# Patient Record
Sex: Female | Born: 1945 | Race: White | Hispanic: No | Marital: Married | Smoking: Former smoker
Health system: Southern US, Community
[De-identification: ages and names within clinical notes are randomized; demographics above are authoritative.]

## PROBLEM LIST (undated history)

## (undated) DIAGNOSIS — S72002A Fracture of unspecified part of neck of left femur, initial encounter for closed fracture: Secondary | ICD-10-CM

## (undated) DIAGNOSIS — N200 Calculus of kidney: Secondary | ICD-10-CM

## (undated) DIAGNOSIS — N390 Urinary tract infection, site not specified: Secondary | ICD-10-CM

## (undated) DIAGNOSIS — R739 Hyperglycemia, unspecified: Secondary | ICD-10-CM

## (undated) DIAGNOSIS — S52501A Unspecified fracture of the lower end of right radius, initial encounter for closed fracture: Secondary | ICD-10-CM

## (undated) HISTORY — PX: COLONOSCOPY: SHX174

## (undated) HISTORY — PX: TUBAL LIGATION: SHX77

## (undated) HISTORY — PX: HEMORRHOID SURGERY: SHX153

---

## 2003-01-10 HISTORY — PX: CARDIAC CATHETERIZATION: SHX172

## 2003-11-21 ENCOUNTER — Observation Stay (HOSPITAL_COMMUNITY): Admission: EM | Admit: 2003-11-21 | Discharge: 2003-11-21 | Payer: Self-pay | Admitting: Emergency Medicine

## 2003-11-30 ENCOUNTER — Inpatient Hospital Stay (HOSPITAL_COMMUNITY): Admission: AD | Admit: 2003-11-30 | Discharge: 2003-12-02 | Payer: Self-pay | Admitting: Cardiology

## 2003-11-30 ENCOUNTER — Ambulatory Visit: Payer: Self-pay | Admitting: Cardiology

## 2004-01-10 HISTORY — PX: CERVICAL FUSION: SHX112

## 2004-04-27 ENCOUNTER — Ambulatory Visit (HOSPITAL_COMMUNITY): Admission: RE | Admit: 2004-04-27 | Discharge: 2004-04-28 | Payer: Self-pay | Admitting: Neurosurgery

## 2009-04-20 ENCOUNTER — Ambulatory Visit: Payer: Self-pay | Admitting: Cardiology

## 2010-01-29 ENCOUNTER — Encounter: Payer: Self-pay | Admitting: *Deleted

## 2011-09-10 HISTORY — PX: FINGER ARTHROPLASTY: SHX5017

## 2011-10-03 ENCOUNTER — Encounter (HOSPITAL_BASED_OUTPATIENT_CLINIC_OR_DEPARTMENT_OTHER): Payer: Self-pay | Admitting: *Deleted

## 2011-10-03 ENCOUNTER — Other Ambulatory Visit: Payer: Self-pay | Admitting: Orthopedic Surgery

## 2011-10-03 NOTE — Progress Notes (Signed)
Will ck with anesthesia about the phentermine

## 2011-10-06 ENCOUNTER — Encounter (HOSPITAL_BASED_OUTPATIENT_CLINIC_OR_DEPARTMENT_OTHER): Payer: Self-pay | Admitting: Anesthesiology

## 2011-10-06 ENCOUNTER — Ambulatory Visit (HOSPITAL_BASED_OUTPATIENT_CLINIC_OR_DEPARTMENT_OTHER): Payer: Medicare Other | Admitting: Anesthesiology

## 2011-10-06 ENCOUNTER — Encounter (HOSPITAL_BASED_OUTPATIENT_CLINIC_OR_DEPARTMENT_OTHER): Payer: Self-pay | Admitting: *Deleted

## 2011-10-06 ENCOUNTER — Ambulatory Visit (HOSPITAL_BASED_OUTPATIENT_CLINIC_OR_DEPARTMENT_OTHER)
Admission: RE | Admit: 2011-10-06 | Discharge: 2011-10-06 | Disposition: A | Discharge status: Home / Self Care | Payer: Medicare Other | Source: Ambulatory Visit | Attending: Orthopedic Surgery | Admitting: Orthopedic Surgery

## 2011-10-06 ENCOUNTER — Encounter (HOSPITAL_BASED_OUTPATIENT_CLINIC_OR_DEPARTMENT_OTHER)
Admission: RE | Disposition: A | Discharge status: Home / Self Care | Payer: Self-pay | Source: Ambulatory Visit | Attending: Orthopedic Surgery

## 2011-10-06 DIAGNOSIS — W268XXA Contact with other sharp object(s), not elsewhere classified, initial encounter: Secondary | ICD-10-CM | POA: Insufficient documentation

## 2011-10-06 DIAGNOSIS — Y92009 Unspecified place in unspecified non-institutional (private) residence as the place of occurrence of the external cause: Secondary | ICD-10-CM | POA: Insufficient documentation

## 2011-10-06 DIAGNOSIS — IMO0002 Reserved for concepts with insufficient information to code with codable children: Secondary | ICD-10-CM | POA: Insufficient documentation

## 2011-10-06 DIAGNOSIS — S65509A Unspecified injury of blood vessel of unspecified finger, initial encounter: Secondary | ICD-10-CM | POA: Insufficient documentation

## 2011-10-06 DIAGNOSIS — Y93G1 Activity, food preparation and clean up: Secondary | ICD-10-CM | POA: Insufficient documentation

## 2011-10-06 DIAGNOSIS — Y998 Other external cause status: Secondary | ICD-10-CM | POA: Insufficient documentation

## 2011-10-06 DIAGNOSIS — S61209A Unspecified open wound of unspecified finger without damage to nail, initial encounter: Secondary | ICD-10-CM | POA: Insufficient documentation

## 2011-10-06 SURGERY — NERVE, TENDON AND ARTERY REPAIR
Anesthesia: General | Site: Finger | Laterality: Left | Wound class: Contaminated

## 2011-10-06 MED ORDER — HEPARIN SODIUM (PORCINE) 1000 UNIT/ML IJ SOLN
INTRAMUSCULAR | Status: DC | PRN
  Administered 2011-10-06: 1000 [IU]

## 2011-10-06 MED ORDER — LIDOCAINE HCL 2 % IJ SOLN
INTRAMUSCULAR | Status: DC | PRN
  Administered 2011-10-06: 7 mL

## 2011-10-06 MED ORDER — HYDROMORPHONE HCL PF 1 MG/ML IJ SOLN: 0.2500 mg | INTRAMUSCULAR | Status: DC | PRN

## 2011-10-06 MED ORDER — OXYCODONE HCL 5 MG/5ML PO SOLN: 5.0000 mg | Freq: Once | ORAL | Status: DC | PRN

## 2011-10-06 MED ORDER — LACTATED RINGERS IR SOLN
Status: DC | PRN
  Administered 2011-10-06: 1

## 2011-10-06 MED ORDER — CHLORHEXIDINE GLUCONATE 4 % EX LIQD: 60.0000 mL | Freq: Once | CUTANEOUS | Status: DC

## 2011-10-06 MED ORDER — DEXAMETHASONE SODIUM PHOSPHATE 10 MG/ML IJ SOLN
INTRAMUSCULAR | Status: DC | PRN
  Administered 2011-10-06: 10 mg via INTRAVENOUS

## 2011-10-06 MED ORDER — HYDROMORPHONE HCL 2 MG PO TABS: 2.0000 mg | ORAL_TABLET | ORAL | Status: DC | PRN

## 2011-10-06 MED ORDER — LIDOCAINE HCL (CARDIAC) 20 MG/ML IV SOLN
INTRAVENOUS | Status: DC | PRN
  Administered 2011-10-06: 100 mg via INTRAVENOUS

## 2011-10-06 MED ORDER — EPHEDRINE SULFATE 50 MG/ML IJ SOLN
INTRAMUSCULAR | Status: DC | PRN
  Administered 2011-10-06: 10 mg via INTRAVENOUS

## 2011-10-06 MED ORDER — PROPOFOL 10 MG/ML IV BOLUS
INTRAVENOUS | Status: DC | PRN
  Administered 2011-10-06: 160 mg via INTRAVENOUS

## 2011-10-06 MED ORDER — DOXYCYCLINE HYCLATE 100 MG PO TABS: 100.0000 mg | ORAL_TABLET | Freq: Two times a day (BID) | ORAL | Status: DC

## 2011-10-06 MED ORDER — OXYCODONE HCL 5 MG PO TABS: 5.0000 mg | ORAL_TABLET | Freq: Once | ORAL | Status: DC | PRN

## 2011-10-06 MED ORDER — VANCOMYCIN HCL IN DEXTROSE 1-5 GM/200ML-% IV SOLN
1000.0000 mg | INTRAVENOUS | Status: AC
  Administered 2011-10-06: 1000 mg via INTRAVENOUS

## 2011-10-06 MED ORDER — ONDANSETRON HCL 4 MG/2ML IJ SOLN
INTRAMUSCULAR | Status: DC | PRN
  Administered 2011-10-06: 4 mg via INTRAVENOUS

## 2011-10-06 MED ORDER — FENTANYL CITRATE 0.05 MG/ML IJ SOLN
INTRAMUSCULAR | Status: DC | PRN
  Administered 2011-10-06: 50 ug via INTRAVENOUS
  Administered 2011-10-06: 100 ug via INTRAVENOUS
  Administered 2011-10-06 (×2): 25 ug via INTRAVENOUS

## 2011-10-06 MED ORDER — LACTATED RINGERS IV SOLN
INTRAVENOUS | Status: DC
  Administered 2011-10-06 (×2): via INTRAVENOUS

## 2011-10-06 MED ORDER — LIDOCAINE HCL (PF) 1 % IJ SOLN
INTRAMUSCULAR | Status: DC | PRN
  Administered 2011-10-06: 10 mL

## 2011-10-06 MED ORDER — DROPERIDOL 2.5 MG/ML IJ SOLN: 0.6250 mg | INTRAMUSCULAR | Status: DC | PRN

## 2011-10-06 SURGICAL SUPPLY — 66 items
BANDAGE ADHESIVE 1X3 (GAUZE/BANDAGES/DRESSINGS) IMPLANT
BANDAGE CONFORM 3  STR LF (GAUZE/BANDAGES/DRESSINGS) IMPLANT
BANDAGE ELASTIC 3 VELCRO ST LF (GAUZE/BANDAGES/DRESSINGS) ×2 IMPLANT
BANDAGE GAUZE ELAST BULKY 4 IN (GAUZE/BANDAGES/DRESSINGS) IMPLANT
BLADE MINI RND TIP GREEN BEAV (BLADE) IMPLANT
BLADE SURG 15 STRL LF DISP TIS (BLADE) ×1 IMPLANT
BLADE SURG 15 STRL SS (BLADE) ×2
BNDG CMPR 9X4 STRL LF SNTH (GAUZE/BANDAGES/DRESSINGS) ×1
BNDG ESMARK 4X9 LF (GAUZE/BANDAGES/DRESSINGS) ×2 IMPLANT
BRUSH SCRUB EZ PLAIN DRY (MISCELLANEOUS) ×2 IMPLANT
CLOTH BEACON ORANGE TIMEOUT ST (SAFETY) ×2 IMPLANT
CORDS BIPOLAR (ELECTRODE) ×2 IMPLANT
COVER MAYO STAND STRL (DRAPES) ×2 IMPLANT
COVER TABLE BACK 60X90 (DRAPES) ×2 IMPLANT
CUFF TOURNIQUET SINGLE 18IN (TOURNIQUET CUFF) ×2 IMPLANT
DECANTER SPIKE VIAL GLASS SM (MISCELLANEOUS) ×2 IMPLANT
DRAPE EXTREMITY T 121X128X90 (DRAPE) ×2 IMPLANT
DRAPE SURG 17X23 STRL (DRAPES) ×2 IMPLANT
GAUZE XEROFORM 1X8 LF (GAUZE/BANDAGES/DRESSINGS) IMPLANT
GLOVE BIO SURGEON STRL SZ 6.5 (GLOVE) ×4 IMPLANT
GLOVE BIOGEL M STRL SZ7.5 (GLOVE) ×2 IMPLANT
GLOVE BIOGEL PI IND STRL 7.0 (GLOVE) ×1 IMPLANT
GLOVE BIOGEL PI IND STRL 8 (GLOVE) ×2 IMPLANT
GLOVE BIOGEL PI INDICATOR 7.0 (GLOVE) ×1
GLOVE BIOGEL PI INDICATOR 8 (GLOVE) ×2
GLOVE ORTHO TXT STRL SZ7.5 (GLOVE) ×2 IMPLANT
GOWN PREVENTION PLUS XLARGE (GOWN DISPOSABLE) ×2 IMPLANT
GOWN PREVENTION PLUS XXLARGE (GOWN DISPOSABLE) ×4 IMPLANT
LOOP VESSEL MAXI BLUE (MISCELLANEOUS) ×2 IMPLANT
NDL SAFETY ECLIPSE 18X1.5 (NEEDLE) ×1 IMPLANT
NEEDLE 27GAX1X1/2 (NEEDLE) ×2 IMPLANT
NEEDLE HYPO 18GX1.5 SHARP (NEEDLE) ×2
NEEDLE HYPO 25X1 1.5 SAFETY (NEEDLE) IMPLANT
NS IRRIG 1000ML POUR BTL (IV SOLUTION) ×2 IMPLANT
PACK BASIN DAY SURGERY FS (CUSTOM PROCEDURE TRAY) ×2 IMPLANT
PAD CAST 3X4 CTTN HI CHSV (CAST SUPPLIES) ×1 IMPLANT
PADDING CAST ABS 4INX4YD NS (CAST SUPPLIES) ×1
PADDING CAST ABS COTTON 4X4 ST (CAST SUPPLIES) ×1 IMPLANT
PADDING CAST COTTON 3X4 STRL (CAST SUPPLIES) ×2
SLEEVE SCD COMPRESS KNEE MED (MISCELLANEOUS) ×2 IMPLANT
SPEAR EYE SURG WECK-CEL (MISCELLANEOUS) ×2 IMPLANT
SPLINT PLASTER CAST XFAST 3X15 (CAST SUPPLIES) ×4 IMPLANT
SPLINT PLASTER XTRA FASTSET 3X (CAST SUPPLIES) ×4
SPONGE GAUZE 4X4 12PLY (GAUZE/BANDAGES/DRESSINGS) ×2 IMPLANT
STOCKINETTE 4X48 STRL (DRAPES) ×2 IMPLANT
STRIP CLOSURE SKIN 1/2X4 (GAUZE/BANDAGES/DRESSINGS) ×2 IMPLANT
SUT ETHILON 10 0 V75 3 (SUTURE) ×2 IMPLANT
SUT ETHILON 5 0 P 3 18 (SUTURE) ×1
SUT ETHILON 9 0 V 100.4 (SUTURE) IMPLANT
SUT FIBERWIRE 3-0 18 TAPR NDL (SUTURE) ×2
SUT MERSILENE 4 0 P 3 (SUTURE) ×2 IMPLANT
SUT MERSILENE 6 0 P 1 (SUTURE) ×2 IMPLANT
SUT NYLON ETHILON 5-0 P-3 1X18 (SUTURE) ×1 IMPLANT
SUT PROLENE 3 0 PS 2 (SUTURE) IMPLANT
SUT SILK 4 0 PS 2 (SUTURE) ×2 IMPLANT
SUT VIC AB 4-0 P-3 18XBRD (SUTURE) IMPLANT
SUT VIC AB 4-0 P2 18 (SUTURE) IMPLANT
SUT VIC AB 4-0 P3 18 (SUTURE)
SUTURE FIBERWR 3-0 18 TAPR NDL (SUTURE) ×1 IMPLANT
SYR 3ML 23GX1 SAFETY (SYRINGE) IMPLANT
SYR BULB 3OZ (MISCELLANEOUS) IMPLANT
SYR CONTROL 10ML LL (SYRINGE) ×4 IMPLANT
TOWEL OR 17X24 6PK STRL BLUE (TOWEL DISPOSABLE) ×2 IMPLANT
TRAY DSU PREP LF (CUSTOM PROCEDURE TRAY) ×2 IMPLANT
UNDERPAD 30X30 INCONTINENT (UNDERPADS AND DIAPERS) ×2 IMPLANT
WATER STERILE IRR 1000ML POUR (IV SOLUTION) IMPLANT

## 2011-10-06 NOTE — Anesthesia Postprocedure Evaluation (Signed)
Anesthesia Post Note  Patient: [name]  Procedure(s) Performed: Procedure(s) (LRB): NERVE, TENDON AND ARTERY REPAIR (Left)  Anesthesia type: general  Patient location: PACU  Post pain: Pain level controlled  Post assessment: Patient's Cardiovascular Status Stable  Last Vitals:  Filed Vitals:   10/06/11 1538  BP:   Pulse: 90  Temp: 36.5 C  Resp: 20    Post vital signs: Reviewed and stable  Level of consciousness: sedated  Complications: No apparent anesthesia complications

## 2011-10-06 NOTE — Transfer of Care (Signed)
Immediate Anesthesia Transfer of Care Note  Patient: [name]  Procedure(s) Performed: Procedure(s) (LRB) with comments: NERVE, TENDON AND ARTERY REPAIR (Left) - Repair tendon artery nerve left long and explore left index   Patient Location: PACU  Anesthesia Type: General  Level of Consciousness: sedated  Airway & Oxygen Therapy: Patient Spontanous Breathing  Post-op Assessment: Report given to PACU RN  Post vital signs: stable  Complications: No apparent anesthesia complications

## 2011-10-06 NOTE — Anesthesia Procedure Notes (Signed)
Procedure Name: LMA Insertion Date/Time: 10/06/2011 11:28 AM Performed by: Burna Cash Pre-anesthesia Checklist: Patient identified, Emergency Drugs available, Suction available and Patient being monitored Patient Re-evaluated:Patient Re-evaluated prior to inductionOxygen Delivery Method: Circle System Utilized Preoxygenation: Pre-oxygenation with 100% oxygen Intubation Type: IV induction Ventilation: Mask ventilation without difficulty LMA: LMA inserted LMA Size: 4.0 Number of attempts: 1 Airway Equipment and Method: bite block Placement Confirmation: positive ETCO2 Tube secured with: Tape Dental Injury: Teeth and Oropharynx as per pre-operative assessment

## 2011-10-06 NOTE — Op Note (Signed)
[id number] 

## 2011-10-06 NOTE — Anesthesia Preprocedure Evaluation (Addendum)
Anesthesia Evaluation  Patient identified by MRN, date of birth, ID band Patient awake    Reviewed: Allergy & Precautions, H&P , NPO status , Patient's Chart, lab work & pertinent test results  History of Anesthesia Complications Negative for: history of anesthetic complications  Airway Mallampati: I TM Distance: >3 FB Neck ROM: Full    Dental  (+) Teeth Intact, Caps and Dental Advisory Given   Pulmonary neg pulmonary ROS,  breath sounds clear to auscultation  Pulmonary exam normal       Cardiovascular negative cardio ROS      Neuro/Psych negative neurological ROS     GI/Hepatic negative GI ROS, Neg liver ROS,   Endo/Other  negative endocrine ROS  Renal/GU negative Renal ROS     Musculoskeletal   Abdominal   Peds  Hematology negative hematology ROS (+)   Anesthesia Other Findings   Reproductive/Obstetrics                           Anesthesia Physical Anesthesia Plan  ASA: I  Anesthesia Plan: General   Post-op Pain Management:    Induction: Intravenous  Airway Management Planned: LMA  Additional Equipment:   Intra-op Plan:   Post-operative Plan: Extubation in OR  Informed Consent: I have reviewed the patients History and Physical, chart, labs and discussed the procedure including the risks, benefits and alternatives for the proposed anesthesia with the patient or authorized representative who has indicated his/her understanding and acceptance.   Dental advisory given  Plan Discussed with: CRNA, Anesthesiologist and Surgeon  Anesthesia Plan Comments:         Anesthesia Quick Evaluation

## 2011-10-06 NOTE — Brief Op Note (Signed)
10/06/2011  1:30 PM  PATIENT:  Drue Novel  66 y.o. female  PRE-OPERATIVE DIAGNOSIS:  Left long laceration tendon arteries and nerves   POST-OPERATIVE DIAGNOSIS:  Left long laceration tendon arteries and nerves   PROCEDURE:  Procedure(s) (LRB) with comments: NERVE, TENDON AND ARTERY REPAIR (Left) - Repair tendon artery nerve left long and explore left index   SURGEON:  Surgeon(s) and Role:    * Wyn Forster., MD - Primary  PHYSICIAN ASSISTANT:   ASSISTANTS: Mallory Shirk.A-C   ANESTHESIA:   general  EBL:  Total I/O In: 1000 [I.V.:1000] Out: -   BLOOD ADMINISTERED:none  DRAINS: none   LOCAL MEDICATIONS USED:  XYLOCAINE   SPECIMEN:  No Specimen  DISPOSITION OF SPECIMEN:  N/A  COUNTS:  YES  TOURNIQUET:   Total Tourniquet Time Documented: Upper Arm (Left) - 100 minutes  DICTATION: .Other Dictation: Dictation Number [id number]  PLAN OF CARE: Discharge to home after PACU  PATIENT DISPOSITION:  PACU - hemodynamically stable.

## 2011-10-06 NOTE — H&P (Signed)
  [name] is an 66 y.o. female.   Chief Complaint: c/o lacerations to letf index and left long fingers HPI: Pt is a 66 y/o female who sustained lacerations to the left index and long fingers on Sunday 10/01/11 after attempting to catch a glass jar that was falling out of her refrigerator. She noted lacerations to the volar aspect of the left index and long fingers. She went to [facility] were the wounds were cleaned and sutured and the patient was referred for evaluation and treatment. Her tetanus is up to date.She was seen in our office on Monday 10/02/11 and scheduled for exploration and repair tendons, arteries and nerves as needed of the left index and long fingers.  Past Medical History  Diagnosis Date  . No pertinent past medical history     Past Surgical History  Procedure Date  . Cardiac catheterization 2005    cone-report in echart-not in epic  . Hemorrhoid surgery   . Cervical fusion 2006  . Tubal ligation   . Colonoscopy     No family history on file. Social History:  reports that she quit smoking about 25 years ago. She does not have any smokeless tobacco history on file. She reports that she does not drink alcohol or use illicit drugs.  Allergies:  Allergies  Allergen Reactions  . Penicillins Hives    No prescriptions prior to admission    No results found for this or any previous visit (from the past 48 hour(s)).  No results found.   Pertinent items are noted in HPI.  Height 5\' 5"  (1.651 m), weight 72.576 kg (160 lb).  General appearance: alert Head: Normocephalic, without obvious abnormality Neck: supple, symmetrical, trachea midline Resp: clear to auscultation bilaterally Cardio: regular rate and rhythm GI: normal findings: bowel sounds normal Extremities: Exam of the left hand reveals laceration at the MP flexion crease of the index finger. She has intact FDP/FDS function in addition to intact sensibility on both the radial and ulnar aspect  of the digit. Exam of the left long reveals an oblique laceration across the P-1 segment. There is absent flexor tendon function and decreased sensibility distal to the laceration. There is good capillary refill and turgor in both the index and long fingers. Pulses: 2+ and symmetric Skin: normal Neurologic: Grossly normal    Assessment/Plan Impression:Probable tendon, artery and nerve lacerations to the right long finger and possibly the right index finger  Plan:To the OR for exploration/repair tendons,arteries and nerves right index and right long fingers.The procedure, risks,benefits and post-op course were discussed with the patient at length and they were in agreement with the plan.   DASNOIT,Iyari Hagner J 10/06/2011, 7:26 AM    H&P documentation: 10/06/2011  -History and Physical Reviewed  -Patient has been re-examined  -No change in the plan of care  Wyn Forster, MD

## 2011-10-09 NOTE — Op Note (Signed)
NAMEPENI, RUPARD                ACCOUNT NO.:  [id number]  MEDICAL RECORD NO.:  [id number]  LOCATION:                                 FACILITY:  PHYSICIAN:  Katy Fitch. Harlee Pursifull, M.D. DATE OF BIRTH:  [birth year]  DATE OF PROCEDURE:  10/06/2011 DATE OF DISCHARGE:                              OPERATIVE REPORT   PREOPERATIVE DIAGNOSIS:  Status post glass laceration to palmar surface of left long and index fingers with loss of sensibility in the long finger and impaired flexion of the index and long finger.  The long finger was lying in full extension evidencing laceration of the flexor digitorum profundus and superficialis tendons.  POSTOPERATIVE DIAGNOSIS:  Laceration of radial proper digital artery, left long finger; laceration of ulnar proper digital artery, left long finger; laceration of flexor digitorum superficialis tendon zone II at A2 pulley, left long finger; laceration of flexor digitorum profundus tendon, zone II, left long finger; laceration of A2 pulley and laceration of A2 pulley and partial laceration of flexor digitorum superficialis tendon of left index finger; also laceration of radial proper digital nerve, left long finger; and laceration of ulnar proper digital nerve, left long finger.  OPERATIONS: 1. Microsurgical repair of left long finger, ulnar proper digital     artery with 10-0 nylon. 2. Microsurgical repair of left long finger, radial proper digital     artery with 10-0 nylon. 3. Microsurgical repair of left long finger, ulnar proper digital     nerve. 4. Microsurgical repair of left long finger, radial proper digital     nerve. 5. Repair of flexor digitorum superficialis tendon slips at     decussation. 6. Repair of flexor digitorum profundus, zone II, A2 pulley, left long     finger. 7. Debridement of partial flap laceration of left index finger, flexor     digitorum superficialis tendon with decision to perform tendon     debridement and repair of  A2 pulley.  OPERATING SURGEON:  Katy Fitch. Kasee Hantz, MD  ASSISTANT:  Marveen Reeks Dasnoit, PA-C  ANESTHESIA:  General by LMA.  SUPERVISED ANESTHESIOLOGIST:  Quita Skye. Krista Blue, MD  INDICATIONS:  [name] is a 66 year old, right-hand-dominant, well- known patient of our practice, who on October 01, 2011, accidentally lost control of a pickle jar which broke in her hand sustaining deep lacerations to the palmar surface of her left index and long fingers, in the index finger directly at the proximal finger flexion crease and in the long finger extending from the proximal finger flexion crease and radial web with the index finger across to the PIP flexion crease.  She had immediate extension posture of her long finger and inability to flex her index finger without pain.  She had complete loss of sensibility in the long finger Evidencing laceration of the radial and ulnar proper digital nerves.  She was seen at the [facility] Emergency Room, where wounds were cleaned and sutured.  She was advised to follow up with a hand surgery consult.  Clinical examination in our office revealed that she had intact capillary refill to her index and long fingers but did have diminished turgor in the long finger.  We  recommended exploration and repair of her neurovascular structures as well as the lacerated flexor tendons.  After informed consent, she is brought to the operating room at this time.  Preoperatively, she was interviewed by Dr. Krista Blue, who recommended general anesthesia by LMA technique.  Our plan was to provide perioperative prophylactic antibiotics in the form of Ancef.  PROCEDURE:  [name] was brought to room #1 of the [facility] and placed in supine position on the operating table.  Following induction of general anesthesia by LMA technique, the left hand and arm were prepped with Betadine soap and solution, sterilely draped.  2 g of Ancef were administered  as an IV prophylactic antibiotic.  The sutures from the [facility] Emergency Room were removed.  The wounds were then cleaned thoroughly with sterile saline.  We performed exsanguination of the left arm and hand with an Esmarch bandage and inflation of arterial tourniquet on the proximal brachium to 220 mmHg.  Following routine surgical time-out, the procedure commenced with opening of the index wound and exploring the flexor sheath.  The radial and ulnar neurovascular bundles were noted to be intact.  There was a flap laceration of the A2 pulley and a long oblique laceration of superficialis tendon that was less than 30% of the diameter tendon; therefore, this was trimmed and the flexor retinaculum closed.  We utilized 6-0 Mersilene suture to anatomic repair of the A2 pulley.  Attention was then turned to the long finger.  With the aid of an operating microscope, we examined the radial and ulnar proper digital nerves and arteries and identified laceration of both proper digital nerves and both proper digital arteries. Apparently, Ms. Shall has adequate dorsal circulation to maintain a viable finger.  The radial and ulnar proper digital arteries were prepared in standard manner by trimming of clot, dilation with a micro vessel dilator followed by use of Tsai solution to obtain anticoagulation and relaxation of the vessels.  The proper digital nerves were likewise identified and prepared in standard manner for microsurgical repair.  With the aid of the operating microscope with various powers, we repaired the radial and ulnar proper digital arteries with 10-0 nylon suture utilizing back wall-first technique.  The proper digital nerves were then repaired with 9-0 nylon utilizing epineural technique.  We then flexed the finger slightly and began our plans for repair of the flexor tendons.  The profundus superficialis tendon slips were easily identified beneath the A3 pulley  distally.  We had to perform a second incision in the palm proximal to the A1 pulley to recover the flexor tendons and pass them distally.  The superficialis tendon was repaired with core suture of 4-0 Mersilene and finishing suture of 6-0 Mersilene.  The profundus tendon was repaired with core suture of 3-0 FiberWire with grasping technique followed by circumferential 6-0 Mersilene finishing suture.  We anatomically repaired the A2 pulley and retinacular sheath and noted full passive motion of the finger.  We had excellent glide of both repairs.  The wounds were then examined after release of the tourniquet.  The long finger developed immediate capillary refill of less than 2 seconds with normal turgor.  The index finger had normal capillary refill and turgor. The wounds were then closed with interrupted sutures of 5-0 nylon.  There were no apparent complications.  Ms. Bieker tolerated the surgery and anesthesia well.  She was placed in compressive dressing with the wrist flexed 45 degrees.  Her MP joint of the index and long  finger flexed at least 60 degrees.  She will be advised to elevate her hand above her heart for the next 48 hours.  We will see her back for followup in the office in 4-5 days.  At that time, we will advance to a passive and place and hold exercise program under the supervision of our hand therapist.  For aftercare, she is provided prescriptions for Dilaudid 2 mg 1 or 2 tablets p.o. q.4-6 h. p.r.n. pain, #30 tablets without refill; also doxycycline 100 mg p.o. q.12 h. x4 days as prophylactic antibiotic.     Katy Fitch Graesyn Schreifels, M.D.     RVS/MEDQ  D:  10/06/2011  T:  10/07/2011  Job:  [id number]  cc:   Doreen Beam, MD

## 2012-04-19 ENCOUNTER — Other Ambulatory Visit: Payer: Self-pay | Admitting: Orthopedic Surgery

## 2012-04-25 ENCOUNTER — Encounter (HOSPITAL_BASED_OUTPATIENT_CLINIC_OR_DEPARTMENT_OTHER): Payer: Self-pay | Admitting: *Deleted

## 2012-04-25 NOTE — Progress Notes (Signed)
Pt was here 9/13 for repair finger from laceration-still cannot move lt long well-

## 2012-05-01 NOTE — H&P (Addendum)
[name] is an 67 y.o. female.   Chief Complaint:c/o chronic stiffness of digits of the left hand HPI: [name] is now 6  months status post left long finger tendon/artery/nerve reconstruction.  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  She has profound tenodesis of her flexors and extensors.     Past Medical History  Diagnosis Date  . No pertinent past medical history   . Medical history non-contributory     Past Surgical History  Procedure Laterality Date  . Cardiac catheterization  2005    cone-report in echart-not in epic  . Hemorrhoid surgery    . Cervical fusion  2006  . Tubal ligation    . Colonoscopy    . Finger arthroplasty  9/13    lt long and index    History reviewed. No pertinent family history. Social History:  reports that she quit smoking about 25 years ago. She does not have any smokeless tobacco history on file. She reports that she does not drink alcohol or use illicit drugs.  Allergies:  Allergies  Allergen Reactions  . Sulfa Antibiotics Rash  . Penicillins Hives    No prescriptions prior to admission    No results found for this or any previous visit (from the past 48 hour(s)).  No results found.   Pertinent items are noted in HPI.  There were no vitals taken for this visit.  General appearance: alert Head: Normocephalic, without obvious abnormality Neck: supple, symmetrical, trachea midline Resp: clear to auscultation bilaterally Cardio: regular rate and rhythm GI: normal findings: bowel sounds normal Extremities:Her finger remains quite stiff despite both the patient and our therapists' efforts.  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  She has no flexion of the long finger and less than 50% flexionn of the ring and small due to her CRPS 2 response despite maximum  therapy during the past six months.  This is due to quadriga and capsular fibrosis. 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  She has general stiffness of the hand due to a CRPS type 2 response following her injuries.  She has had maximum therapy efforts that were limited by the need to protect her flexor repairs.  Plan:To the OR for tenolysis flexor/extensor tendons with capsular releases left long finger and manipulation of IP/MP joints left hand.The procedure, risks,benefits and post-op course were discussed with the patient at length and they were in agreement with the plan. [name] understands that she will have to participate vigorously in therapy and in a home based exercise program to improve the stiffness of her hand.  There is the risk that she will not gain or maintain motion that we achieve in the operating room by releasing scar and joint capsules.  She understands that this is a "best effort" by her surgeon and therapy team and that her participation will ultimately be the determinant of success or not in this endeavor.  DASNOIT,[name] 05/01/2012, 4:33 PM  H&P documentation: 05/02/2012  -History and Physical Reviewed  -Patient has been re-examined  -No change in the plan  of care  Wyn Forster, MD

## 2012-05-02 ENCOUNTER — Ambulatory Visit (HOSPITAL_BASED_OUTPATIENT_CLINIC_OR_DEPARTMENT_OTHER)
Admission: RE | Admit: 2012-05-02 | Discharge: 2012-05-02 | Disposition: A | Discharge status: Home / Self Care | Payer: Medicare Other | Source: Ambulatory Visit | Attending: Orthopedic Surgery | Admitting: Orthopedic Surgery

## 2012-05-02 ENCOUNTER — Ambulatory Visit (HOSPITAL_BASED_OUTPATIENT_CLINIC_OR_DEPARTMENT_OTHER): Payer: Medicare Other | Admitting: Anesthesiology

## 2012-05-02 ENCOUNTER — Encounter (HOSPITAL_BASED_OUTPATIENT_CLINIC_OR_DEPARTMENT_OTHER)
Admission: RE | Disposition: A | Discharge status: Home / Self Care | Payer: Self-pay | Source: Ambulatory Visit | Attending: Orthopedic Surgery

## 2012-05-02 ENCOUNTER — Encounter (HOSPITAL_BASED_OUTPATIENT_CLINIC_OR_DEPARTMENT_OTHER): Payer: Self-pay | Admitting: *Deleted

## 2012-05-02 ENCOUNTER — Encounter (HOSPITAL_BASED_OUTPATIENT_CLINIC_OR_DEPARTMENT_OTHER): Payer: Self-pay | Admitting: Anesthesiology

## 2012-05-02 DIAGNOSIS — Z882 Allergy status to sulfonamides status: Secondary | ICD-10-CM | POA: Insufficient documentation

## 2012-05-02 DIAGNOSIS — Z87828 Personal history of other (healed) physical injury and trauma: Secondary | ICD-10-CM | POA: Insufficient documentation

## 2012-05-02 DIAGNOSIS — M6789 Other specified disorders of synovium and tendon, multiple sites: Secondary | ICD-10-CM | POA: Insufficient documentation

## 2012-05-02 DIAGNOSIS — Z880 Allergy status to penicillin: Secondary | ICD-10-CM | POA: Insufficient documentation

## 2012-05-02 DIAGNOSIS — M25649 Stiffness of unspecified hand, not elsewhere classified: Secondary | ICD-10-CM | POA: Insufficient documentation

## 2012-05-02 DIAGNOSIS — G5640 Causalgia of unspecified upper limb: Secondary | ICD-10-CM | POA: Insufficient documentation

## 2012-05-02 DIAGNOSIS — Z87891 Personal history of nicotine dependence: Secondary | ICD-10-CM | POA: Insufficient documentation

## 2012-05-02 HISTORY — PX: TENOLYSIS: SHX396

## 2012-05-02 LAB — POCT HEMOGLOBIN-HEMACUE: Hemoglobin: 12.1 g/dL (ref 12.0–15.0)

## 2012-05-02 SURGERY — INCISION, TENDON SHEATH
Anesthesia: Regional | Site: Hand | Laterality: Left | Wound class: Clean

## 2012-05-02 MED ORDER — ONDANSETRON HCL 4 MG/2ML IJ SOLN
INTRAMUSCULAR | Status: DC | PRN
  Administered 2012-05-02: 4 mg via INTRAVENOUS

## 2012-05-02 MED ORDER — FENTANYL CITRATE 0.05 MG/ML IJ SOLN: 50.0000 ug | INTRAMUSCULAR | Status: DC | PRN

## 2012-05-02 MED ORDER — FENTANYL CITRATE 0.05 MG/ML IJ SOLN
25.0000 ug | INTRAMUSCULAR | Status: DC | PRN
  Administered 2012-05-02: 25 ug via INTRAVENOUS
  Administered 2012-05-02: 50 ug via INTRAVENOUS
  Administered 2012-05-02 (×2): 25 ug via INTRAVENOUS

## 2012-05-02 MED ORDER — ROPIVACAINE HCL 5 MG/ML IJ SOLN
INTRAMUSCULAR | Status: DC | PRN
  Administered 2012-05-02: 3 mL

## 2012-05-02 MED ORDER — LACTATED RINGERS IV SOLN
INTRAVENOUS | Status: DC
  Administered 2012-05-02 (×3): via INTRAVENOUS

## 2012-05-02 MED ORDER — HYDROCODONE-ACETAMINOPHEN 5-325 MG PO TABS: ORAL_TABLET | ORAL | Status: DC

## 2012-05-02 MED ORDER — DOXYCYCLINE HYCLATE 100 MG PO TABS: 100.0000 mg | ORAL_TABLET | Freq: Two times a day (BID) | ORAL | Status: DC

## 2012-05-02 MED ORDER — METOCLOPRAMIDE HCL 5 MG/ML IJ SOLN: 10.0000 mg | Freq: Once | INTRAMUSCULAR | Status: DC | PRN

## 2012-05-02 MED ORDER — FENTANYL CITRATE 0.05 MG/ML IJ SOLN
INTRAMUSCULAR | Status: DC | PRN
  Administered 2012-05-02: 50 ug via INTRAVENOUS

## 2012-05-02 MED ORDER — OXYCODONE HCL 5 MG PO TABS
5.0000 mg | ORAL_TABLET | Freq: Once | ORAL | Status: AC | PRN
  Administered 2012-05-02: 5 mg via ORAL

## 2012-05-02 MED ORDER — PROPOFOL 10 MG/ML IV EMUL
INTRAVENOUS | Status: DC | PRN
  Administered 2012-05-02: 100 ug/kg/min via INTRAVENOUS

## 2012-05-02 MED ORDER — MIDAZOLAM HCL 5 MG/5ML IJ SOLN
INTRAMUSCULAR | Status: DC | PRN
  Administered 2012-05-02: 1 mg via INTRAVENOUS

## 2012-05-02 MED ORDER — CHLORHEXIDINE GLUCONATE 4 % EX LIQD: 60.0000 mL | Freq: Once | CUTANEOUS | Status: DC

## 2012-05-02 MED ORDER — VANCOMYCIN HCL IN DEXTROSE 1-5 GM/200ML-% IV SOLN
1000.0000 mg | INTRAVENOUS | Status: AC
  Administered 2012-05-02: 1000 mg via INTRAVENOUS

## 2012-05-02 MED ORDER — OXYCODONE HCL 5 MG/5ML PO SOLN: 5.0000 mg | Freq: Once | ORAL | Status: AC | PRN

## 2012-05-02 MED ORDER — LIDOCAINE HCL 2 % IJ SOLN
INTRAMUSCULAR | Status: DC | PRN
  Administered 2012-05-02: 2 mL

## 2012-05-02 MED ORDER — LIDOCAINE HCL (CARDIAC) 20 MG/ML IV SOLN
INTRAVENOUS | Status: DC | PRN
  Administered 2012-05-02: 50 mg via INTRAVENOUS

## 2012-05-02 MED ORDER — MIDAZOLAM HCL 2 MG/2ML IJ SOLN: 1.0000 mg | INTRAMUSCULAR | Status: DC | PRN

## 2012-05-02 SURGICAL SUPPLY — 54 items
BANDAGE ADHESIVE 1X3 (GAUZE/BANDAGES/DRESSINGS) IMPLANT
BANDAGE ELASTIC 3 VELCRO ST LF (GAUZE/BANDAGES/DRESSINGS) ×2 IMPLANT
BANDAGE GAUZE ELAST BULKY 4 IN (GAUZE/BANDAGES/DRESSINGS) IMPLANT
BLADE MINI RND TIP GREEN BEAV (BLADE) ×2 IMPLANT
BLADE SURG 15 STRL LF DISP TIS (BLADE) ×1 IMPLANT
BLADE SURG 15 STRL SS (BLADE) ×2
BNDG CMPR 9X4 STRL LF SNTH (GAUZE/BANDAGES/DRESSINGS) ×1
BNDG CMPR MD 5X2 ELC HKLP STRL (GAUZE/BANDAGES/DRESSINGS)
BNDG ELASTIC 2 VLCR STRL LF (GAUZE/BANDAGES/DRESSINGS) IMPLANT
BNDG ESMARK 4X9 LF (GAUZE/BANDAGES/DRESSINGS) ×2 IMPLANT
BRUSH SCRUB EZ PLAIN DRY (MISCELLANEOUS) ×2 IMPLANT
CLOTH BEACON ORANGE TIMEOUT ST (SAFETY) ×2 IMPLANT
CORDS BIPOLAR (ELECTRODE) ×2 IMPLANT
COVER MAYO STAND STRL (DRAPES) ×2 IMPLANT
COVER TABLE BACK 60X90 (DRAPES) ×2 IMPLANT
CUFF TOURNIQUET SINGLE 18IN (TOURNIQUET CUFF) ×2 IMPLANT
DECANTER SPIKE VIAL GLASS SM (MISCELLANEOUS) IMPLANT
DRAPE EXTREMITY T 121X128X90 (DRAPE) ×2 IMPLANT
DRAPE SURG 17X23 STRL (DRAPES) ×2 IMPLANT
GAUZE XEROFORM 1X8 LF (GAUZE/BANDAGES/DRESSINGS) IMPLANT
GLOVE BIO SURGEON STRL SZ 6.5 (GLOVE) ×4 IMPLANT
GLOVE BIOGEL M STRL SZ7.5 (GLOVE) ×2 IMPLANT
GLOVE BIOGEL PI IND STRL 7.0 (GLOVE) ×1 IMPLANT
GLOVE BIOGEL PI INDICATOR 7.0 (GLOVE) ×1
GLOVE ORTHO TXT STRL SZ7.5 (GLOVE) ×2 IMPLANT
GOWN BRE IMP PREV XXLGXLNG (GOWN DISPOSABLE) ×4 IMPLANT
GOWN PREVENTION PLUS XLARGE (GOWN DISPOSABLE) ×2 IMPLANT
LOOP VESSEL MAXI BLUE (MISCELLANEOUS) IMPLANT
NEEDLE 27GAX1X1/2 (NEEDLE) ×2 IMPLANT
NS IRRIG 1000ML POUR BTL (IV SOLUTION) ×2 IMPLANT
PACK BASIN DAY SURGERY FS (CUSTOM PROCEDURE TRAY) ×2 IMPLANT
PAD CAST 3X4 CTTN HI CHSV (CAST SUPPLIES) ×1 IMPLANT
PADDING CAST ABS 4INX4YD NS (CAST SUPPLIES) ×1
PADDING CAST ABS COTTON 4X4 ST (CAST SUPPLIES) ×1 IMPLANT
PADDING CAST COTTON 3X4 STRL (CAST SUPPLIES) ×2
PADDING UNDERCAST 2  STERILE (CAST SUPPLIES) ×2 IMPLANT
SLEEVE SCD COMPRESS KNEE MED (MISCELLANEOUS) IMPLANT
SPLINT PLASTER CAST XFAST 3X15 (CAST SUPPLIES) IMPLANT
SPLINT PLASTER XTRA FASTSET 3X (CAST SUPPLIES)
SPONGE GAUZE 4X4 12PLY (GAUZE/BANDAGES/DRESSINGS) ×2 IMPLANT
STOCKINETTE 4X48 STRL (DRAPES) ×2 IMPLANT
STRIP CLOSURE SKIN 1/2X4 (GAUZE/BANDAGES/DRESSINGS) ×2 IMPLANT
SUT FIBERWIRE 3-0 18 TAPR NDL (SUTURE) ×2
SUT PROLENE 3 0 PS 2 (SUTURE) IMPLANT
SUT PROLENE 4 0 P 3 18 (SUTURE) ×2 IMPLANT
SUT VIC AB 4-0 P-3 18XBRD (SUTURE) ×1 IMPLANT
SUT VIC AB 4-0 P3 18 (SUTURE) ×2
SUTURE FIBERWR 3-0 18 TAPR NDL (SUTURE) ×1 IMPLANT
SYR 3ML 23GX1 SAFETY (SYRINGE) IMPLANT
SYR BULB 3OZ (MISCELLANEOUS) ×2 IMPLANT
SYR CONTROL 10ML LL (SYRINGE) ×2 IMPLANT
TOWEL OR 17X24 6PK STRL BLUE (TOWEL DISPOSABLE) ×4 IMPLANT
TRAY DSU PREP LF (CUSTOM PROCEDURE TRAY) ×2 IMPLANT
UNDERPAD 30X30 INCONTINENT (UNDERPADS AND DIAPERS) ×2 IMPLANT

## 2012-05-02 NOTE — Transfer of Care (Signed)
Immediate Anesthesia Transfer of Care Note  Patient: [name]  Procedure(s) Performed: Procedure(s): LEFT LONG TENOLYSIS CAPSULE RELEASES MANIPULATION OF IP/MP JOINTS (Left)  Patient Location: PACU  Anesthesia Type:Bier block  Level of Consciousness: awake, alert  and oriented  Airway & Oxygen Therapy: Patient Spontanous Breathing and Patient connected to face mask oxygen  Post-op Assessment: Report given to PACU RN and Post -op Vital signs reviewed and stable  Post vital signs: Reviewed and stable  Complications: No apparent anesthesia complications

## 2012-05-02 NOTE — Anesthesia Preprocedure Evaluation (Addendum)
Anesthesia Evaluation  Patient identified by MRN, date of birth, ID band Patient awake    Reviewed: Allergy & Precautions, H&P , NPO status , Patient's Chart, lab work & pertinent test results, reviewed documented beta blocker date and time   Airway Mallampati: II TM Distance: >3 FB Neck ROM: full    Dental   Pulmonary neg pulmonary ROS,  breath sounds clear to auscultation        Cardiovascular negative cardio ROS  Rhythm:regular     Neuro/Psych negative neurological ROS  negative psych ROS   GI/Hepatic negative GI ROS, Neg liver ROS,   Endo/Other  negative endocrine ROS  Renal/GU negative Renal ROS  negative genitourinary   Musculoskeletal   Abdominal   Peds  Hematology negative hematology ROS (+)   Anesthesia Other Findings See surgeon's H&P   Reproductive/Obstetrics negative OB ROS                           Anesthesia Physical Anesthesia Plan  ASA: I  Anesthesia Plan: MAC and Bier Block   Post-op Pain Management:    Induction:   Airway Management Planned: Simple Face Mask  Additional Equipment:   Intra-op Plan:   Post-operative Plan:   Informed Consent: I have reviewed the patients History and Physical, chart, labs and discussed the procedure including the risks, benefits and alternatives for the proposed anesthesia with the patient or authorized representative who has indicated his/her understanding and acceptance.   Dental Advisory Given  Plan Discussed with: CRNA and Surgeon  Anesthesia Plan Comments:        Anesthesia Quick Evaluation

## 2012-05-02 NOTE — Anesthesia Postprocedure Evaluation (Deleted)
Anesthesia Post Note  Patient: [name]  Procedure(s) Performed: Procedure(s) (LRB): LEFT LONG TENOLYSIS CAPSULE RELEASES MANIPULATION OF IP/MP JOINTS (Left)  Anesthesia type: General  Patient location: PACU  Post pain: Pain level controlled  Post assessment: Patient's Cardiovascular Status Stable  Last Vitals:  Filed Vitals:   05/02/12 1300  BP: 168/71  Pulse: 62  Temp:   Resp: 9    Post vital signs: Reviewed and stable  Level of consciousness: alert  Complications: No apparent anesthesia complications

## 2012-05-02 NOTE — Anesthesia Postprocedure Evaluation (Signed)
Anesthesia Post Note  Patient: [name]  Procedure(s) Performed: Procedure(s) (LRB): LEFT LONG TENOLYSIS CAPSULE RELEASES MANIPULATION OF IP/MP JOINTS (Left)  Anesthesia type: MAC  Patient location: PACU  Post pain: Pain level controlled  Post assessment: Patient's Cardiovascular Status Stable  Last Vitals:  Filed Vitals:   05/02/12 1300  BP: 168/71  Pulse: 62  Temp:   Resp: 9    Post vital signs: Reviewed and stable  Level of consciousness: alert  Complications: No apparent anesthesia complications

## 2012-05-02 NOTE — Brief Op Note (Signed)
05/02/2012  11:37 AM  PATIENT:  Drue Novel  67 y.o. female  PRE-OPERATIVE DIAGNOSIS:  TENODESIS STIFF LEFT HAND FLEXOR EXTENSOR  POST-OPERATIVE DIAGNOSIS:  TENODESIS STIFF LEFT HAND FLEXOR EXTENSOR  PROCEDURE:  Procedure(s): LEFT LONG TENOLYSIS CAPSULE RELEASES MANIPULATION OF IP/MP JOINTS (Left)  SURGEON:  Surgeon(s) and Role:    * Wyn Forster., MD - Primary  PHYSICIAN ASSISTANT:   ASSISTANTS: Mallory Shirk.A-C   ANESTHESIA:   local and regional  EBL:  Total I/O In: 250 [I.V.:250] Out: -   BLOOD ADMINISTERED:none  DRAINS: none   LOCAL MEDICATIONS USED:  Ropivacaine metacarpal head block  SPECIMEN:  No Specimen  DISPOSITION OF SPECIMEN:  N/A  COUNTS:  YES  TOURNIQUET:   Total Tourniquet Time Documented: Upper Arm (Left) - 107 minutes Total: Upper Arm (Left) - 107 minutes   DICTATION: .Other Dictation: Dictation Number [id number]  PLAN OF CARE: Discharge to home after PACU  PATIENT DISPOSITION:  PACU - hemodynamically stable.   Delay start of Pharmacological VTE agent (>24hrs) due to surgical blood loss or risk of bleeding: not applicable

## 2012-05-02 NOTE — Op Note (Signed)
[id number] 

## 2012-05-03 ENCOUNTER — Encounter (HOSPITAL_BASED_OUTPATIENT_CLINIC_OR_DEPARTMENT_OTHER): Payer: Self-pay | Admitting: Orthopedic Surgery

## 2012-05-03 NOTE — Op Note (Deleted)
[name], [name]                ACCOUNT NO.:  [id number]  MEDICAL RECORD NO.:  [id number]  LOCATION:                                 FACILITY:  PHYSICIAN:  Katy Fitch. Semaj Coburn, M.D. DATE OF BIRTH:  1945/09/29  DATE OF PROCEDURE:  05/02/2012 DATE OF DISCHARGE:                              OPERATIVE REPORT   PREOPERATIVE DIAGNOSIS:  Profound stiffness in the left hand, status post complex injury left long finger sustained September 2013 with zone 2 decussation level lacerations of flexor digitorum profundus and superficialis tendons zone 2 over proximal phalanx with laceration of radial proper digital artery and nerve and ulnar proper digital artery and nerve of left long finger.  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  After 6 months of therapy and awaiting adequate time for her tendon repairs to heal.  She proceeds with intent to tenolysis at this time. Preoperatively, we had a detailed conference with our hand therapist who has worked with this patient [name] for the past 6 months as well as in school and her daughter.  We pointed out that any motion we gain in the operating room at this time, will need to be sustained by a very vigorous postoperative therapy.  If this patient was unable to participate in the therapy both at home and under supervision we will return to the ED stiffness that we have previously experience.  We have had a detailed informed consent, x2.  Our therapist is ready  to participate in a daily therapy program postoperatively.  We proceeded with tenolysis and gentle manipulation of the adjacent index long ring and small finger IP joints at this time.  SPECIFIC OPERATIVE PROCEDURES: 1. Tenolysis flexor digitorum profundus, left long finger and palm. 2. Tenolysis of flexor digitorum superficialis, radial and ulnar slips     of zone 2 as well as in palm 3 PIP capsulectomy from volar approach     for PIP capsulectomy from dorsal approach with tenolysis of the     extensors overlying proximal phalanx and PIP capsule with partial     resection of collateral ligaments and mobilization of PIP joint     after dorsal capsulectomy followed by reinforcement of profundus     repair due to identification of approximately 5 mm between gap and     profound adhesions.  OPERATING SURGEON:  Verlene Mayer, MD.  ASSISTANT:  Marveen Reeks Dasnoit, PA-C.  ANESTHESIA:  Left proximal brachial IV regional supplemented by IV sedation.  SUPERVISING ANESTHESIOLOGIST:  Margit Hanks, MD.  INDICATIONS:  This patient is a 67 year old woman well acquainted with our practice.  She had been a former patient.  In September, 2013 she  sustained a complex injury at home when she dropped a pickle jar and sustained deep glass laceration to the proximal phalangeal segment of the left long finger.  She presented a few days following the injury after being initially treated in [place], [place].  She was noted to have an extended finger with loss of sensibility and delayed capillary refill, but otherwise a viable finger.  We advised immediate exploration repair of her structures that were injured.  We anticipated digital artery repair digital nerve repair in zone 2 flexor tendon repair. Surgeons accomplished without complication.  Using standard core suture. Finishing suture for the flexor tendons.  Repair of the flexor sheath and microsurgical repair of the radial and ulnar  proper digital arteries and radial and ulnar proper digital nerves.  In the early postoperative period this patient experienced hypersensitivity and was extremely reluctant to move her fingers.  Our therapists were very generous to work with her for months, but ultimately we did achieve about 50% motion finger passively.  I then asked her to continue with a home based active exercise program. Our goal was to have to restore full passive flexion of her fingers, so that we could perform a tenolysis at this time.  She returns anticipating flexor and extensor tenolysis, PIP capsulectomy of the long finger and gentle manipulation examination of the adjacent fingers under anesthesia.  Our prognosis is very guarded based on our past experience during the 6 months.  Our therapists will do the level best to try to help her mobilize her hand.  If her sensitivity remains, she will likely have permanent impairment of her flexion.  An alternative strategy for this thing would be to consider staged tendon reconstruction.  However, due to her marked stiffness for the dystrophic response, I would be reluctant to proceed along that pathway. For the record, I trained with Dr. Alice Reichert who was in [place], [place], who was the individual who conceived and developed the entire stage tendon reconstruction process.  I have had extensive experience with this and understand the subjective prerequisite for a successful outcome which I do not believe we need in the circumstances.  Preoperatively, this patient was interviewed by Dr. Gelene Mink of anesthesia.  Her allergies to sulfa and penicillin were noted.  She was provided 1 g of vancomycin as an IV prophylactic antibiotic.  PROCEDURE:  This patient was transferred to room #6 of the [facility], placed in supine position on the operating table.  Following a detailed anesthesia informed consent.  IV regional block at the proximal  brachial level was placed without complication under Dr. Thornton Dales direct supervision.  The left hand arm was prepped with Betadine soap and solution, sterilely draped.  Prior to incision, we also infiltrated 2% lidocaine at metacarpal head level to augment the IV regional block.  Following a routine surgical time-out, we proceeded to resect the prior surgical scar which was a Brunner zigzag incision on the palmar surface of the long finger.  We meticulously identify the flexor sheath and noted normal appearing flexor tendons beyond the A3 pulley.  Between the A3 pulley and the A1 pulley, there was dense scar and profound scarring between the superficialis profundus slips as well as to the annular pulleys.  With great care, the neurovascular bundles were mobilized followed by entry in the flexor sheath proximal to the A3 pulley, distal to the A2 pulley, proximal to A2 pulley, proximally and distally to the A1 pulley. A meticulous tenolysis was carried out with  tenotomy scissors.  The tenotomy osteotomes as designed by Dr. Meals use of an Allis clamp with a manner puncture and with direct dissection with a fine tenotomy scissors and micro rongeur.  It was clear that the interphalangeal joints of the long finger were so severely fibrosed that we need to perform formal capsulectomy of the PIP joint.  We subsequently addressed the dorsal surface of the long finger with dorsal mid lateral incisions which allowed exposure of the PIP capsule.  The transverse retinacular fibers were released followed by use of scissors and a Freer to free the common extensor tendon as well as the intrinsic contributions to the lateral bands over the proximal phalangeal segment.  The dorsal capsule of the PIP joint was an opaque scar.  This was meticulously dissected with a Beaver blade, and the dorsal 20% of the collateral ligaments were released followed by use of a Henner micro elevator to elevate  the collateral ligaments off the proximal phalangeal head.  We subsequently immobilized the volar aspect of the PIP joint and was able to recover passive flexion of the DIP and 95 degrees.  We then returned to the flexor sheath and continued with meticulous tenolysis ultimately noting at the distal margin of the A2 pulley that there was a 5-mm gap of marked tendon scarring suggesting that the profundus repair had gapped somewhat during rehab.  The superficialis slips were intact.  After meticulous tenolysis of the superficialis slips.  I performed a gathering stitch with multiple grasping sutures of 3-0 FiberWire reinforcing the profundus to allow immediate active range of motion exercises.  At the conclusion of the procedure, we could use an Allis clamp at the proximal palm, proximal to A1, and obtained 80 degrees flexion of the PIP joint with traction on the superficialis and 90 degrees of flexion of the PIP joint and trace of flexion at the DIP joint with traction on the profundus.  Given the overall stiffness of the hand, we will accept this range of motion and try to work from here.  Given the degree of scarring found in a perfect setting this patient would be a candidate for staged tendon reconstruction in the manner Hunter, in the setting we have experienced in the past 6 months, I personally would be extremely reluctant to embark on a staged tendon reconstruction which did involve as many as three or more procedures.  Our entire team will do at the level best to try to maintain the motion gained in the OR.  If stiffness returns, I personally would be in obligate for accepting this as a salvage procedure.  For postoperative pain control, Ropivacaine 0.25% without epinephrine was infiltrated in the palm around the common neurovascular bundle of the long finger and 2% lidocaine and on the dorsal aspect of the finger. The wounds were then closed with mattress suture and simple  suture of 5- 0 nylon on the palmar surface of the long finger and hand and on the dorsum of 4-0 Prolene.  The hand was placed in a voluminous gauze dressing with the wrist in 20 degrees of dorsiflexion to facilitate gentle range of motion exercises immediately postoperatively.  There were no apparent complications.  For aftercare, this patient will be provided prescriptions for Vicodin 7.5 mg one p.o. every 4-6 hours p.r.n. pain #30 tablets without refill, also she will use doxycycline 100 mg p.o. b.i.d. x4 days as prophylactic antibiotic.     Katy Fitch Paulanthony Gleaves, M.D.     RVS/MEDQ  D:  05/02/2012  T:  05/02/2012  Job:  [id number]

## 2012-06-27 NOTE — Op Note (Signed)
NAME:  [name], [name]                ACCOUNT NO.:  [id number]  MEDICAL RECORD NO.:  [id number]  LOCATION:                                 FACILITY:  PHYSICIAN:  Naysa Puskas V. Laine Fonner, M.D. DATE OF BIRTH:  05/20/1945  DATE OF PROCEDURE:  05/02/2012 DATE OF DISCHARGE:                              OPERATIVE REPORT   PREOPERATIVE DIAGNOSIS:  Profound stiffness in the left hand, status post complex injury left long finger sustained September 2013 with zone 2 decussation level lacerations of flexor digitorum profundus and superficialis tendons zone 2 over proximal phalanx with laceration of radial proper digital artery and nerve and ulnar proper digital artery and nerve of left long finger.  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  After 6 months of therapy and awaiting adequate time for her tendon repairs to heal.  She proceeds with intent to tenolysis at this time. Preoperatively, we had a detailed conference with our hand therapist who has worked with this patient [name] for the past 6 months as well as in school and her daughter.  We pointed out that any motion we gain in the operating room at this time, will need to be sustained by a very vigorous postoperative therapy.  If this patient was unable to participate in the therapy both at home and under supervision we will return to the ED stiffness that we have previously experience.  We have had a detailed informed consent, x2.  Our therapist is ready  to participate in a daily therapy program postoperatively.  We proceeded with tenolysis and gentle manipulation of the adjacent index long ring and small finger IP joints at this time.  SPECIFIC OPERATIVE PROCEDURES: 1. Tenolysis flexor digitorum profundus, left long finger and palm. 2. Tenolysis of flexor digitorum superficialis, radial and ulnar slips     of zone 2 as well as in palm 3 PIP capsulectomy from volar approach     for PIP capsulectomy from dorsal approach with tenolysis of the     extensors overlying proximal phalanx and PIP capsule with partial     resection of collateral ligaments and mobilization of PIP joint     after dorsal capsulectomy followed by reinforcement of profundus     repair due to identification of approximately 5 mm between gap and     profound adhesions.  OPERATING SURGEON:  Murry Khiev V Ellenie Salome, MD.  ASSISTANT:  Cyruss Arata J Dasnoit, PA-C.  ANESTHESIA:  Left proximal brachial IV regional supplemented by IV sedation.  SUPERVISING ANESTHESIOLOGIST:  Charles E Frederick, MD.  INDICATIONS:  This patient is a 67-year-old woman well acquainted with our practice.  She had been a former patient.  In September, 2013 she   sustained a complex injury at home when she dropped a pickle jar and sustained deep glass laceration to the proximal phalangeal segment of the left long finger.  She presented a few days following the injury after being initially treated in [place], [place].  She was noted to have an extended finger with loss of sensibility and delayed capillary refill, but otherwise a viable finger.  We advised immediate exploration repair of her structures that were injured.  We anticipated digital artery repair digital nerve repair in zone 2 flexor tendon repair. Surgeons accomplished without complication.  Using standard core suture. Finishing suture for the flexor tendons.  Repair of the flexor sheath and microsurgical repair of the radial and ulnar  proper digital arteries and radial and ulnar proper digital nerves.  In the early postoperative period this patient experienced hypersensitivity and was extremely reluctant to move her fingers.  Our therapists were very generous to work with her for months, but ultimately we did achieve about 50% motion finger passively.  I then asked her to continue with a home based active exercise program. Our goal was to have to restore full passive flexion of her fingers, so that we could perform a tenolysis at this time.  She returns anticipating flexor and extensor tenolysis, PIP capsulectomy of the long finger and gentle manipulation examination of the adjacent fingers under anesthesia.  Our prognosis is very guarded based on our past experience during the 6 months.  Our therapists will do the level best to try to help her mobilize her hand.  If her sensitivity remains, she will likely have permanent impairment of her flexion.  An alternative strategy for this thing would be to consider staged tendon reconstruction.  However, due to her marked stiffness for the dystrophic response, I would be reluctant to proceed along that pathway. For the record, I trained with Dr. James Hunter who was in [place], [place], who was the individual who conceived and developed the entire stage tendon reconstruction process.  I have had extensive experience with this and understand the subjective prerequisite for a successful outcome which I do not believe we need in the circumstances.  Preoperatively, this patient was interviewed by Dr. Frederick of anesthesia.  Her allergies to sulfa and penicillin were noted.  She was provided 1 g of vancomycin as an IV prophylactic antibiotic.  PROCEDURE:  This patient was transferred to room #6 of the [facility], placed in supine position on the operating table.  Following a detailed anesthesia informed consent.  IV regional block at the proximal  brachial level was placed without complication under Dr. Frederick's direct supervision.  The left hand arm was prepped with Betadine soap and solution, sterilely draped.  Prior to incision, we also infiltrated 2% lidocaine at metacarpal head level to augment the IV regional block.  Following a routine surgical time-out, we proceeded to resect the prior surgical scar which was a Brunner zigzag incision on the palmar surface of the long finger.  We meticulously identify the flexor sheath and noted normal appearing flexor tendons beyond the A3 pulley.  Between the A3 pulley and the A1 pulley, there was dense scar and profound scarring between the superficialis profundus slips as well as to the annular pulleys.  With great care, the neurovascular bundles were mobilized followed by entry in the flexor sheath proximal to the A3 pulley, distal to the A2 pulley, proximal to A2 pulley, proximally and distally to the A1 pulley. A meticulous tenolysis was carried out with   tenotomy scissors.  The tenotomy osteotomes as designed by Dr. Meals use of an Allis clamp with a manner puncture and with direct dissection with a fine tenotomy scissors and micro rongeur.  It was clear that the interphalangeal joints of the long finger were so severely fibrosed that we need to perform formal capsulectomy of the PIP joint.  We subsequently addressed the dorsal surface of the long finger with dorsal mid lateral incisions which allowed exposure of the PIP capsule.  The transverse retinacular fibers were released followed by use of scissors and a Freer to free the common extensor tendon as well as the intrinsic contributions to the lateral bands over the proximal phalangeal segment.  The dorsal capsule of the PIP joint was an opaque scar.  This was meticulously dissected with a Beaver blade, and the dorsal 20% of the collateral ligaments were released followed by use of a Henner micro elevator to elevate  the collateral ligaments off the proximal phalangeal head.  We subsequently immobilized the volar aspect of the PIP joint and was able to recover passive flexion of the DIP and 95 degrees.  We then returned to the flexor sheath and continued with meticulous tenolysis ultimately noting at the distal margin of the A2 pulley that there was a 5-mm gap of marked tendon scarring suggesting that the profundus repair had gapped somewhat during rehab.  The superficialis slips were intact.  After meticulous tenolysis of the superficialis slips.  I performed a gathering stitch with multiple grasping sutures of 3-0 FiberWire reinforcing the profundus to allow immediate active range of motion exercises.  At the conclusion of the procedure, we could use an Allis clamp at the proximal palm, proximal to A1, and obtained 80 degrees flexion of the PIP joint with traction on the superficialis and 90 degrees of flexion of the PIP joint and trace of flexion at the DIP joint with traction on the profundus.  Given the overall stiffness of the hand, we will accept this range of motion and try to work from here.  Given the degree of scarring found in a perfect setting this patient would be a candidate for staged tendon reconstruction in the manner Hunter, in the setting we have experienced in the past 6 months, I personally would be extremely reluctant to embark on a staged tendon reconstruction which did involve as many as three or more procedures.  Our entire team will do at the level best to try to maintain the motion gained in the OR.  If stiffness returns, I personally would be in obligate for accepting this as a salvage procedure.  For postoperative pain control, Ropivacaine 0.25% without epinephrine was infiltrated in the palm around the common neurovascular bundle of the long finger and 2% lidocaine and on the dorsal aspect of the finger. The wounds were then closed with mattress suture and simple  suture of 5- 0 nylon on the palmar surface of the long finger and hand and on the dorsum of 4-0 Prolene.  The hand was placed in a voluminous gauze dressing with the wrist in 20 degrees of dorsiflexion to facilitate gentle range of motion exercises immediately postoperatively.  There were no apparent complications.  For aftercare, this patient will be provided prescriptions for Vicodin 7.5 mg one p.o. every 4-6 hours p.r.n. pain #30 tablets without refill, also she will use doxycycline 100 mg p.o. b.i.d. x4 days as prophylactic antibiotic.     Tulio Facundo V. Mallie Giambra, M.D.     RVS/MEDQ  D:    05/02/2012  T:  05/02/2012  Job:  [id number] 

## 2012-08-30 ENCOUNTER — Encounter (INDEPENDENT_AMBULATORY_CARE_PROVIDER_SITE_OTHER): Payer: Self-pay | Admitting: *Deleted

## 2014-05-04 ENCOUNTER — Encounter (INDEPENDENT_AMBULATORY_CARE_PROVIDER_SITE_OTHER): Payer: Self-pay | Admitting: *Deleted

## 2014-05-07 ENCOUNTER — Encounter (INDEPENDENT_AMBULATORY_CARE_PROVIDER_SITE_OTHER): Payer: Self-pay | Admitting: *Deleted

## 2014-05-07 ENCOUNTER — Other Ambulatory Visit (INDEPENDENT_AMBULATORY_CARE_PROVIDER_SITE_OTHER): Payer: Self-pay | Admitting: *Deleted

## 2014-05-07 ENCOUNTER — Encounter (INDEPENDENT_AMBULATORY_CARE_PROVIDER_SITE_OTHER): Payer: Self-pay

## 2014-05-07 DIAGNOSIS — Z1211 Encounter for screening for malignant neoplasm of colon: Secondary | ICD-10-CM

## 2014-06-23 ENCOUNTER — Other Ambulatory Visit: Payer: Self-pay | Admitting: Orthopedic Surgery

## 2014-06-24 ENCOUNTER — Encounter (HOSPITAL_BASED_OUTPATIENT_CLINIC_OR_DEPARTMENT_OTHER): Payer: Self-pay | Admitting: *Deleted

## 2014-06-25 ENCOUNTER — Encounter (HOSPITAL_BASED_OUTPATIENT_CLINIC_OR_DEPARTMENT_OTHER)
Admission: RE | Disposition: A | Discharge status: Home / Self Care | Payer: Self-pay | Source: Ambulatory Visit | Attending: Orthopedic Surgery

## 2014-06-25 ENCOUNTER — Ambulatory Visit (HOSPITAL_BASED_OUTPATIENT_CLINIC_OR_DEPARTMENT_OTHER)
Admission: RE | Admit: 2014-06-25 | Discharge: 2014-06-25 | Disposition: A | Discharge status: Home / Self Care | Payer: Medicare Other | Source: Ambulatory Visit | Attending: Orthopedic Surgery | Admitting: Orthopedic Surgery

## 2014-06-25 ENCOUNTER — Encounter (HOSPITAL_BASED_OUTPATIENT_CLINIC_OR_DEPARTMENT_OTHER): Payer: Self-pay | Admitting: Anesthesiology

## 2014-06-25 ENCOUNTER — Ambulatory Visit (HOSPITAL_BASED_OUTPATIENT_CLINIC_OR_DEPARTMENT_OTHER): Payer: Medicare Other | Admitting: Anesthesiology

## 2014-06-25 DIAGNOSIS — S52571A Other intraarticular fracture of lower end of right radius, initial encounter for closed fracture: Secondary | ICD-10-CM | POA: Insufficient documentation

## 2014-06-25 DIAGNOSIS — Z882 Allergy status to sulfonamides status: Secondary | ICD-10-CM | POA: Insufficient documentation

## 2014-06-25 DIAGNOSIS — W19XXXA Unspecified fall, initial encounter: Secondary | ICD-10-CM | POA: Diagnosis not present

## 2014-06-25 DIAGNOSIS — S52501A Unspecified fracture of the lower end of right radius, initial encounter for closed fracture: Secondary | ICD-10-CM | POA: Diagnosis present

## 2014-06-25 DIAGNOSIS — Z880 Allergy status to penicillin: Secondary | ICD-10-CM | POA: Diagnosis not present

## 2014-06-25 DIAGNOSIS — Z87891 Personal history of nicotine dependence: Secondary | ICD-10-CM | POA: Diagnosis not present

## 2014-06-25 HISTORY — PX: OPEN REDUCTION INTERNAL FIXATION (ORIF) DISTAL RADIAL FRACTURE: SHX5989

## 2014-06-25 HISTORY — DX: Unspecified fracture of the lower end of right radius, initial encounter for closed fracture: S52.501A

## 2014-06-25 SURGERY — OPEN REDUCTION INTERNAL FIXATION (ORIF) DISTAL RADIUS FRACTURE
Anesthesia: Regional | Site: Arm Lower | Laterality: Right

## 2014-06-25 MED ORDER — VANCOMYCIN HCL IN DEXTROSE 1-5 GM/200ML-% IV SOLN
1000.0000 mg | INTRAVENOUS | Status: AC
  Administered 2014-06-25: 1000 mg via INTRAVENOUS

## 2014-06-25 MED ORDER — MIDAZOLAM HCL 2 MG/2ML IJ SOLN
INTRAMUSCULAR | Status: AC
  Filled 2014-06-25: qty 2

## 2014-06-25 MED ORDER — ACETAMINOPHEN 500 MG PO TABS
1000.0000 mg | ORAL_TABLET | Freq: Once | ORAL | Status: DC
Start: 2014-06-25 — End: 2014-06-25

## 2014-06-25 MED ORDER — PROPOFOL 10 MG/ML IV BOLUS
INTRAVENOUS | Status: DC | PRN
  Administered 2014-06-25: 200 mg via INTRAVENOUS

## 2014-06-25 MED ORDER — VANCOMYCIN HCL IN DEXTROSE 1-5 GM/200ML-% IV SOLN
INTRAVENOUS | Status: AC
  Filled 2014-06-25: qty 200

## 2014-06-25 MED ORDER — HYDROMORPHONE HCL 1 MG/ML IJ SOLN
INTRAMUSCULAR | Status: AC
  Filled 2014-06-25: qty 1

## 2014-06-25 MED ORDER — OXYCODONE HCL 5 MG PO TABS
5.0000 mg | ORAL_TABLET | Freq: Once | ORAL | Status: AC | PRN
  Administered 2014-06-25: 5 mg via ORAL

## 2014-06-25 MED ORDER — FENTANYL CITRATE (PF) 100 MCG/2ML IJ SOLN
INTRAMUSCULAR | Status: AC
  Filled 2014-06-25: qty 4

## 2014-06-25 MED ORDER — LACTATED RINGERS IV SOLN
INTRAVENOUS | Status: DC
Start: 2014-06-25 — End: 2014-06-25
  Administered 2014-06-25: 11:00:00 via INTRAVENOUS

## 2014-06-25 MED ORDER — ACETAMINOPHEN 160 MG/5ML PO SOLN: 960.0000 mg | Freq: Once | ORAL | Status: DC

## 2014-06-25 MED ORDER — FENTANYL CITRATE (PF) 100 MCG/2ML IJ SOLN
50.0000 ug | INTRAMUSCULAR | Status: DC | PRN
  Administered 2014-06-25: 50 ug via INTRAVENOUS
  Administered 2014-06-25: 100 ug via INTRAVENOUS

## 2014-06-25 MED ORDER — HYDROMORPHONE HCL 1 MG/ML IJ SOLN
0.2500 mg | INTRAMUSCULAR | Status: DC | PRN
  Administered 2014-06-25 (×4): 0.5 mg via INTRAVENOUS

## 2014-06-25 MED ORDER — SCOPOLAMINE 1 MG/3DAYS TD PT72: 1.0000 | MEDICATED_PATCH | Freq: Once | TRANSDERMAL | Status: DC | PRN

## 2014-06-25 MED ORDER — LIDOCAINE HCL (CARDIAC) 20 MG/ML IV SOLN
INTRAVENOUS | Status: DC | PRN
  Administered 2014-06-25: 60 mg via INTRAVENOUS

## 2014-06-25 MED ORDER — ONDANSETRON HCL 4 MG/2ML IJ SOLN
INTRAMUSCULAR | Status: DC | PRN
  Administered 2014-06-25: 4 mg via INTRAVENOUS

## 2014-06-25 MED ORDER — BUPIVACAINE-EPINEPHRINE (PF) 0.5% -1:200000 IJ SOLN
INTRAMUSCULAR | Status: DC | PRN
  Administered 2014-06-25: 25 mL via PERINEURAL

## 2014-06-25 MED ORDER — HYDROCODONE-ACETAMINOPHEN 5-325 MG PO TABS: ORAL_TABLET | ORAL | Status: DC

## 2014-06-25 MED ORDER — MEPERIDINE HCL 25 MG/ML IJ SOLN: 6.2500 mg | INTRAMUSCULAR | Status: DC | PRN

## 2014-06-25 MED ORDER — CHLORHEXIDINE GLUCONATE 4 % EX LIQD: 60.0000 mL | Freq: Once | CUTANEOUS | Status: DC

## 2014-06-25 MED ORDER — OXYCODONE HCL 5 MG/5ML PO SOLN: 5.0000 mg | Freq: Once | ORAL | Status: AC | PRN

## 2014-06-25 MED ORDER — VITAMIN C 500 MG PO TABS: 500.0000 mg | ORAL_TABLET | Freq: Every day | ORAL | Status: AC

## 2014-06-25 MED ORDER — LACTATED RINGERS IV SOLN
500.0000 mL | INTRAVENOUS | Status: DC
  Administered 2014-06-25 (×2): via INTRAVENOUS

## 2014-06-25 MED ORDER — MIDAZOLAM HCL 2 MG/2ML IJ SOLN
1.0000 mg | INTRAMUSCULAR | Status: DC | PRN
Start: 2014-06-25 — End: 2014-06-25
  Administered 2014-06-25: 2 mg via INTRAVENOUS

## 2014-06-25 MED ORDER — OXYCODONE HCL 5 MG PO TABS
ORAL_TABLET | ORAL | Status: AC
Start: 2014-06-25 — End: 2014-06-25
  Filled 2014-06-25: qty 1

## 2014-06-25 MED ORDER — DEXAMETHASONE SODIUM PHOSPHATE 4 MG/ML IJ SOLN
INTRAMUSCULAR | Status: DC | PRN
  Administered 2014-06-25: 10 mg via INTRAVENOUS

## 2014-06-25 MED ORDER — FENTANYL CITRATE (PF) 100 MCG/2ML IJ SOLN
INTRAMUSCULAR | Status: AC
  Filled 2014-06-25: qty 2

## 2014-06-25 SURGICAL SUPPLY — 70 items
BANDAGE ELASTIC 3 VELCRO ST LF (GAUZE/BANDAGES/DRESSINGS) ×3 IMPLANT
BIT DRILL 2.0 LNG QUCK RELEASE (BIT) ×1 IMPLANT
BIT DRILL 2.8X5 QR DISP (BIT) ×3 IMPLANT
BLADE MINI RND TIP GREEN BEAV (BLADE) IMPLANT
BLADE SURG 15 STRL LF DISP TIS (BLADE) ×2 IMPLANT
BLADE SURG 15 STRL SS (BLADE) ×6
BNDG CMPR 9X4 STRL LF SNTH (GAUZE/BANDAGES/DRESSINGS) ×1
BNDG ESMARK 4X9 LF (GAUZE/BANDAGES/DRESSINGS) ×3 IMPLANT
BNDG GAUZE ELAST 4 BULKY (GAUZE/BANDAGES/DRESSINGS) ×3 IMPLANT
BONE CHIP PRESERV 5CC PCAN5 (Bone Implant) ×3 IMPLANT
CHLORAPREP W/TINT 26ML (MISCELLANEOUS) ×3 IMPLANT
CORDS BIPOLAR (ELECTRODE) ×3 IMPLANT
COVER BACK TABLE 60X90IN (DRAPES) ×3 IMPLANT
COVER MAYO STAND STRL (DRAPES) ×3 IMPLANT
DRAPE EXTREMITY T 121X128X90 (DRAPE) ×3 IMPLANT
DRAPE OEC MINIVIEW 54X84 (DRAPES) ×3 IMPLANT
DRAPE SURG 17X23 STRL (DRAPES) ×3 IMPLANT
DRILL 2.0 LNG QUICK RELEASE (BIT) ×3
GAUZE SPONGE 4X4 12PLY STRL (GAUZE/BANDAGES/DRESSINGS) ×3 IMPLANT
GAUZE XEROFORM 1X8 LF (GAUZE/BANDAGES/DRESSINGS) ×3 IMPLANT
GLOVE BIO SURGEON STRL SZ 6.5 (GLOVE) ×2 IMPLANT
GLOVE BIO SURGEON STRL SZ7.5 (GLOVE) ×3 IMPLANT
GLOVE BIO SURGEONS STRL SZ 6.5 (GLOVE) ×1
GLOVE BIOGEL PI IND STRL 7.0 (GLOVE) ×2 IMPLANT
GLOVE BIOGEL PI IND STRL 8 (GLOVE) ×1 IMPLANT
GLOVE BIOGEL PI IND STRL 8.5 (GLOVE) IMPLANT
GLOVE BIOGEL PI INDICATOR 7.0 (GLOVE) ×4
GLOVE BIOGEL PI INDICATOR 8 (GLOVE) ×2
GLOVE BIOGEL PI INDICATOR 8.5 (GLOVE)
GLOVE SURG ORTHO 8.0 STRL STRW (GLOVE) IMPLANT
GOWN STRL REUS W/ TWL LRG LVL3 (GOWN DISPOSABLE) ×1 IMPLANT
GOWN STRL REUS W/TWL LRG LVL3 (GOWN DISPOSABLE) ×3
GUIDEWIRE ORTHO 0.054X6 (WIRE) ×9 IMPLANT
NEEDLE HYPO 25X1 1.5 SAFETY (NEEDLE) IMPLANT
NS IRRIG 1000ML POUR BTL (IV SOLUTION) ×3 IMPLANT
PACK BASIN DAY SURGERY FS (CUSTOM PROCEDURE TRAY) ×3 IMPLANT
PAD CAST 3X4 CTTN HI CHSV (CAST SUPPLIES) ×1 IMPLANT
PADDING CAST ABS 4INX4YD NS (CAST SUPPLIES) ×2
PADDING CAST ABS COTTON 4X4 ST (CAST SUPPLIES) ×1 IMPLANT
PADDING CAST COTTON 3X4 STRL (CAST SUPPLIES) ×3
PLATE STD RT ACULOC 2 (Plate) ×3 IMPLANT
SCREW BN FT 16X2.3XLCK HEX CRT (Screw) ×1 IMPLANT
SCREW CORT FT 18X2.3XLCK HEX (Screw) ×1 IMPLANT
SCREW CORTICAL LOCKING 2.3X16M (Screw) ×9 IMPLANT
SCREW CORTICAL LOCKING 2.3X18M (Screw) ×12 IMPLANT
SCREW FX16X2.3XLCK SMTH NS CRT (Screw) ×2 IMPLANT
SCREW FX18X2.3XSMTH LCK NS CRT (Screw) ×3 IMPLANT
SCREW HEXALOBE NON-LOCK 3.5X14 (Screw) ×3 IMPLANT
SCREW NLCKG 13 3.5X13 HEXA (Screw) ×1 IMPLANT
SCREW NON-LOCK 3.5X13 (Screw) ×3 IMPLANT
SCREW NONLOCK HEX 3.5X12 (Screw) ×6 IMPLANT
SLEEVE SCD COMPRESS KNEE MED (MISCELLANEOUS) ×3 IMPLANT
SPLINT PLASTER CAST XFAST 4X15 (CAST SUPPLIES) IMPLANT
SPLINT PLASTER XTRA FAST SET 4 (CAST SUPPLIES)
STOCKINETTE 4X48 STRL (DRAPES) ×3 IMPLANT
SUCTION FRAZIER TIP 10 FR DISP (SUCTIONS) IMPLANT
SUT ETHILON 3 0 PS 1 (SUTURE) IMPLANT
SUT ETHILON 4 0 PS 2 18 (SUTURE) ×3 IMPLANT
SUT VIC AB 2-0 SH 27 (SUTURE) ×3
SUT VIC AB 2-0 SH 27XBRD (SUTURE) ×1 IMPLANT
SUT VIC AB 3-0 PS1 18 (SUTURE)
SUT VIC AB 3-0 PS1 18XBRD (SUTURE) IMPLANT
SUT VICRYL 4-0 PS2 18IN ABS (SUTURE) ×3 IMPLANT
SYR BULB 3OZ (MISCELLANEOUS) ×3 IMPLANT
SYR CONTROL 10ML LL (SYRINGE) IMPLANT
TOWEL OR 17X24 6PK STRL BLUE (TOWEL DISPOSABLE) ×6 IMPLANT
TOWEL OR NON WOVEN STRL DISP B (DISPOSABLE) ×3 IMPLANT
TUBE CONNECTING 20'X1/4 (TUBING)
TUBE CONNECTING 20X1/4 (TUBING) IMPLANT
UNDERPAD 30X30 (UNDERPADS AND DIAPERS) ×3 IMPLANT

## 2014-06-25 NOTE — Progress Notes (Signed)
Assisted Dr. Crews with right, ultrasound guided, interscalene  block. Side rails up, monitors on throughout procedure. See vital signs in flow sheet. Tolerated Procedure well. 

## 2014-06-25 NOTE — Anesthesia Postprocedure Evaluation (Signed)
  Anesthesia Post-op Note  Patient: [name]  Procedure(s) Performed: Procedure(s): OPEN REDUCTION INTERNAL FIXATION (ORIF) RIGHT  DISTAL RADIUS FRACTURE (Right)  Patient Location: PACU  Anesthesia Type: General, Regional   Level of Consciousness: awake, alert  and oriented  Airway and Oxygen Therapy: Patient Spontanous Breathing  Post-op Pain: mild  Post-op Assessment: Post-op Vital signs reviewed  Post-op Vital Signs: Reviewed  Last Vitals:  Filed Vitals:   06/25/14 1500  BP: 103/44  Pulse: 82  Temp: 36.5 C  Resp: 16    Complications: No apparent anesthesia complications

## 2014-06-25 NOTE — Brief Op Note (Signed)
06/25/2014  1:01 PM  PATIENT:  [name]  69 y.o. female  PRE-OPERATIVE DIAGNOSIS:  RIGHT DISTAL RADIUS FRACTURE  POST-OPERATIVE DIAGNOSIS:  right distal radius fracture  PROCEDURE:  Procedure(s): OPEN REDUCTION INTERNAL FIXATION (ORIF) RIGHT  DISTAL RADIUS FRACTURE (Right)  SURGEON:  Surgeon(s) and Role:    * Leanora Cover, MD - Primary  PHYSICIAN ASSISTANT:   ASSISTANTS: Daryll Brod, MD   ANESTHESIA:   regional and general  EBL:     BLOOD ADMINISTERED:none  DRAINS: none   LOCAL MEDICATIONS USED:  NONE  SPECIMEN:  No Specimen  DISPOSITION OF SPECIMEN:  N/A  COUNTS:  YES  TOURNIQUET:   Total Tourniquet Time Documented: Upper Arm (Right) - 65 minutes Total: Upper Arm (Right) - 65 minutes   DICTATION: .Other Dictation: Dictation Number [id number]  PLAN OF CARE: Discharge to home after PACU  PATIENT DISPOSITION:  PACU - hemodynamically stable.

## 2014-06-25 NOTE — Discharge Instructions (Signed)
Post Anesthesia Home Care Instructions  Activity: Get plenty of rest for the remainder of the day. A responsible adult should stay with you for 24 hours following the procedure.  For the next 24 hours, DO NOT: -Drive a car -Paediatric nurse -Drink alcoholic beverages -Take any medication unless instructed by your physician -Make any legal decisions or sign important papers.  Meals: Start with liquid foods such as gelatin or soup. Progress to regular foods as tolerated. Avoid greasy, spicy, heavy foods. If nausea and/or vomiting occur, drink only clear liquids until the nausea and/or vomiting subsides. Call your physician if vomiting continues.  Special Instructions/Symptoms: Your throat may feel dry or sore from the anesthesia or the breathing tube placed in your throat during surgery. If this causes discomfort, gargle with warm salt water. The discomfort should disappear within 24 hours.  If you had a scopolamine patch placed behind your ear for the management of post- operative nausea and/or vomiting:  1. The medication in the patch is effective for 72 hours, after which it should be removed.  Wrap patch in a tissue and discard in the trash. Wash hands thoroughly with soap and water. 2. You may remove the patch earlier than 72 hours if you experience unpleasant side effects which may include dry mouth, dizziness or visual disturbances. 3. Avoid touching the patch. Wash your hands with soap and water after contact with the patch.   Regional Anesthesia Blocks  1. Numbness or the inability to move the "blocked" extremity may last from 3-48 hours after placement. The length of time depends on the medication injected and your individual response to the medication. If the numbness is not going away after 48 hours, call your surgeon.  2. The extremity that is blocked will need to be protected until the numbness is gone and the  Strength has returned. Because you cannot feel it, you will need  to take extra care to avoid injury. Because it may be weak, you may have difficulty moving it or using it. You may not know what position it is in without looking at it while the block is in effect.  3. For blocks in the legs and feet, returning to weight bearing and walking needs to be done carefully. You will need to wait until the numbness is entirely gone and the strength has returned. You should be able to move your leg and foot normally before you try and bear weight or walk. You will need someone to be with you when you first try to ensure you do not fall and possibly risk injury.  4. Bruising and tenderness at the needle site are common side effects and will resolve in a few days.  5. Persistent numbness or new problems with movement should be communicated to the surgeon or the [facility] [phone] [place] [phone]).   [facility] Instructions Hand Surgery  Wound Care: Keep your hand elevated above the level of your heart.  Do not allow it to dangle by your side.  Keep the dressing dry and do not remove it unless your doctor advises you to do so.  He will usually change it at the time of your post-op visit.  Moving your fingers is advised to stimulate circulation but will depend on the site of your surgery.  If you have a splint applied, your doctor will advise you regarding movement.  Activity: Do not drive or operate machinery today.  Rest today and then you may return  then you may return to your normal activity and work as indicated by your physician.  Diet:  Drink liquids today or eat a light diet.  You may resume a regular diet tomorrow.    General expectations: Pain for two to three days. Fingers may become slightly swollen.  Call your doctor if any of the following occur: Severe pain not relieved by pain medication. Elevated temperature. Dressing soaked with blood. Inability to move fingers. White or bluish color to fingers.  

## 2014-06-25 NOTE — Op Note (Signed)
[id number] 

## 2014-06-25 NOTE — Transfer of Care (Signed)
Immediate Anesthesia Transfer of Care Note  Patient: [name]  Procedure(s) Performed: Procedure(s): OPEN REDUCTION INTERNAL FIXATION (ORIF) RIGHT  DISTAL RADIUS FRACTURE (Right)  Patient Location: PACU  Anesthesia Type:GA combined with regional for post-op pain  Level of Consciousness: sedated  Airway & Oxygen Therapy: Patient Spontanous Breathing and Patient connected to face mask oxygen  Post-op Assessment: Report given to RN and Post -op Vital signs reviewed and stable  Post vital signs: Reviewed and stable  Last Vitals:  Filed Vitals:   06/25/14 1120  BP:   Pulse: 93  Temp:   Resp: 15    Complications: No apparent anesthesia complications

## 2014-06-25 NOTE — Anesthesia Procedure Notes (Addendum)
Anesthesia Regional Block:  Supraclavicular block  Pre-Anesthetic Checklist: ,, timeout performed, Correct Patient, Correct Site, Correct Laterality, Correct Procedure, Correct Position, site marked, Risks and benefits discussed,  Surgical consent,  Pre-op evaluation,  At surgeon's request and post-op pain management  Laterality: Right and Upper  Prep: chloraprep       Needles:  Injection technique: Single-shot  Needle Type: Echogenic Stimulator Needle     Needle Length: 5cm 5 cm Needle Gauge: 21 and 21 G    Additional Needles:  Procedures: ultrasound guided (picture in chart) Supraclavicular block Narrative:  Start time: 06/25/2014 11:00 AM End time: 06/25/2014 11:05 AM Injection made incrementally with aspirations every 5 mL.  Performed by: Personally  Anesthesiologist: CREWS, DAVID   Procedure Name: LMA Insertion Performed by: Terrance Mass Pre-anesthesia Checklist: Patient identified, Timeout performed, Emergency Drugs available, Suction available and Patient being monitored Oxygen Delivery Method: Circle system utilized Preoxygenation: Pre-oxygenation with 100% oxygen Intubation Type: IV induction Ventilation: Mask ventilation without difficulty LMA: LMA inserted LMA Size: 4.0    Performed by: Terrance Mass Tube type: Oral Number of attempts: 1 Placement Confirmation: positive ETCO2 Tube secured with: Tape Dental Injury: Teeth and Oropharynx as per pre-operative assessment

## 2014-06-25 NOTE — Anesthesia Preprocedure Evaluation (Signed)
Anesthesia Evaluation  Patient identified by MRN, date of birth, ID band Patient awake    Reviewed: Allergy & Precautions, NPO status , Patient's Chart, lab work & pertinent test results  Airway Mallampati: I  TM Distance: >3 FB Neck ROM: Full    Dental  (+) Teeth Intact, Dental Advisory Given   Pulmonary former smoker,  breath sounds clear to auscultation        Cardiovascular Rhythm:Regular Rate:Normal     Neuro/Psych    GI/Hepatic   Endo/Other    Renal/GU      Musculoskeletal   Abdominal   Peds  Hematology   Anesthesia Other Findings   Reproductive/Obstetrics                             Anesthesia Physical Anesthesia Plan  ASA: II  Anesthesia Plan: General and Regional   Post-op Pain Management:    Induction: Intravenous  Airway Management Planned: LMA  Additional Equipment:   Intra-op Plan:   Post-operative Plan: Extubation in OR  Informed Consent: I have reviewed the patients History and Physical, chart, labs and discussed the procedure including the risks, benefits and alternatives for the proposed anesthesia with the patient or authorized representative who has indicated his/her understanding and acceptance.   Dental advisory given  Plan Discussed with: CRNA, Anesthesiologist and Surgeon  Anesthesia Plan Comments:         Anesthesia Quick Evaluation

## 2014-06-25 NOTE — H&P (Signed)
  [name] is an 69 y.o. female.   Chief Complaint: right distal radius fracture HPI: 69 yo rhd female states she fell 06/22/14 injuring right wrist.  Seen at [facility] ED where XR revealed distal radius fracture.  Splinted and followed up in office.  She reports no previous injury to right wrist and no other injury at this time.  She wishes to proceed with operative fixation.  Past Medical History  Diagnosis Date  . No pertinent past medical history   . Medical history non-contributory   . Distal radius fracture, right     Past Surgical History  Procedure Laterality Date  . Cardiac catheterization  2005    cone-report in [place] in epic  . Hemorrhoid surgery    . Cervical fusion  2006  . Tubal ligation    . Colonoscopy    . Finger arthroplasty  9/13    lt long and index  . Tenolysis Left 05/02/2012    Procedure: LEFT LONG TENOLYSIS CAPSULE RELEASES MANIPULATION OF IP/MP JOINTS;  Surgeon: Cammie Sickle., MD;  Location: [place];  Service: Orthopedics;  Laterality: Left;    History reviewed. No pertinent family history. Social History:  reports that she quit smoking about 27 years ago. She does not have any smokeless tobacco history on file. She reports that she does not drink alcohol or use illicit drugs.  Allergies:  Allergies  Allergen Reactions  . Sulfa Antibiotics Rash  . Penicillins Hives    No prescriptions prior to admission    No results found for this or any previous visit (from the past 53 hour(s)).  No results found.   A comprehensive review of systems was negative except for: Ears, nose, mouth, throat, and face: positive for hearing loss  Height 5\' 5"  (1.651 m), weight 70.308 kg (155 lb).  General appearance: alert, cooperative and appears stated age Head: Normocephalic, without obvious abnormality, atraumatic Neck: supple, symmetrical, trachea midline Resp: clear to auscultation bilaterally Cardio: regular rate and  rhythm GI: non tender Extremities: intact sensation and capillary refill all digits.  +epl/fpl/io.  no wounds. Pulses: 2+ and symmetric Skin: Skin color, texture, turgor normal. No rashes or lesions Neurologic: Grossly normal Incision/Wound:  none  Assessment/Plan Right distal radius fracture.  Non operative and operative treatment options were discussed with the patient and patient wishes to proceed with operative treatment. Risks, benefits, and alternatives of surgery were discussed and the patient agrees with the plan of care.   Vallery Mcdade R 06/25/2014, 8:30 AM

## 2014-06-26 ENCOUNTER — Encounter (HOSPITAL_BASED_OUTPATIENT_CLINIC_OR_DEPARTMENT_OTHER): Payer: Self-pay | Admitting: Orthopedic Surgery

## 2014-06-26 NOTE — Op Note (Signed)
Sanchez, Alyssa                 ACCOUNT NO.:  [id number]  MEDICAL RECORD NO.:  [id number]  LOCATION:                                 FACILITY:  PHYSICIAN:  Leanora Cover, MD             DATE OF BIRTH:  DATE OF PROCEDURE:  06/25/2014 DATE OF DISCHARGE:                              OPERATIVE REPORT   PREOPERATIVE DIAGNOSIS:  Right distal radius comminuted intra-articular fracture.  POSTOPERATIVE DIAGNOSIS:  Right distal radius comminuted intra-articular fracture.  PROCEDURE:  Open reduction and internal fixation, right comminuted intra- articular distal radius fracture.  SURGEON:  Leanora Cover, MD  ASSISTANT:  Daryll Brod, M.D.  ANESTHESIA:  General with regional.  IV FLUIDS:  Per anesthesia flow sheet.  ESTIMATED BLOOD LOSS:  Minimal.  COMPLICATIONS:  None.  SPECIMENS:  None.  TOURNIQUET TIME:  65 minutes.  DISPOSITION:  Stable to PACU.  INDICATIONS:  [name] is a 69 year old female who states she fell a few days ago injuring her right wrist.  She was seen in [facility] Emergency Department where radiographs were taken revealing a distal radius fracture.  She was splinted and followed up in the office.  We discussed nonoperative and operative treatment options.  She wished to proceed with operative treatment.  Risks, benefits, and alternatives of surgery were discussed including risk of blood loss, infection, damage to nerves, vessels, tendons, ligaments, bone; failure of surgery; need for additional surgery, complications with wound healing, continued pain, nonunion, malunion, stiffness.  She voiced understanding of these risks and elected to proceed.  OPERATIVE COURSE:  After being identified preoperatively by myself, the patient and I agreed upon procedure and site of procedure.  Surgical site was marked.  The risks, benefits, and alternatives of surgery were reviewed, and she wished to proceed.  Surgical consent had been signed. Vancomycin was given as  preoperative antibiotic prophylaxis due to penicillin allergies.  She was transported to the operating room and placed on the operating room table in a supine position with the right upper extremity on arm board.  General anesthesia was induced by the anesthesiologist.  A regional block had been performed by Anesthesia in preoperative holding.  Right upper extremity was prepped and draped in normal sterile orthopedic fashion.  A surgical pause was performed between surgeons, anesthesia, and operating room staff, and all were in agreement as to the patient, procedure, and site of procedure. Tourniquet at the proximal aspect of the extremity was inflated to 250 mmHg after exsanguination of the limb with an Esmarch bandage.  Standard volar Mallie Mussel approach was used.  Bipolar electrocautery was used to obtain hemostasis.  The superficial and deep portions of the FCR tendon sheath were incised, the FCR and FPL swept ulnarly to protect the palmar cutaneous branch of the median nerve.  The pronator quadratus was released and elevated with periosteal elevators.  The brachioradialis was released.  The fracture site was easily identified.  It was cleared of any soft tissue interposition.  C-arm was used in AP and lateral projections.  It was noted there was impaction of the bone dorsally and a void when the fracture was reduced.  It was felt that bone grafting was appropriate.  Cancellous bone chips were used to graft the fracture site.  The C-arm was again used to ensure appropriate grafting, which was the case.  A standard plate from the [employer] volar distal radial locking set was selected and secured to the bone with guide pins.  It was adjusted for fit.  Standard AO drilling and measuring technique was used.  C-arm was used in the AP, lateral, and oblique projections throughout the case to aid in reduction and position of hardware.  A single screw was placed in the slotted hole in the shaft of the  plate. The distal holes were then filled with locking pegs with the exception of styloid holes, which were filled with locking screws.  The remaining 2 holes in the shaft of the plate were filled with nonlocking screws. The C-arm was used in AP, lateral, and oblique projections to ensure appropriate reduction and position of hardware, which was the case.  The wound was copiously irrigated with sterile saline.  The pronator quadratus was repaired back over top of the plate using 2-0 Vicryl suture in a figure-of-eight fashion.  Three inverted interrupted 4-0 Vicryl sutures were placed in the subcutaneous tissues.  The skin was closed with 4-0 nylon in a horizontal mattress fashion.  The wound was dressed with sterile Xeroform, 4x4s, and wrapped with Kerlix.  A volar splint was placed and wrapped with Kerlix and Ace bandage.  Tourniquet was deflated at 64 minutes.  Fingertips were pink with brisk capillary refill after deflation of the tourniquet.  Operative drapes were broken down and the patient was awoken from anesthesia safely.  She was transferred back to stretcher and taken to PACU in stable condition.  I will see her back in the office in 1 week for postoperative followup.  I will give her [place], one to two p.o. q.6 hours p.r.n. pain, dispensed #40.  She has taken this in the past with good relief.     Leanora Cover, MD     KK/MEDQ  D:  06/25/2014  T:  06/26/2014  Job:  [id number]

## 2014-07-07 ENCOUNTER — Telehealth (INDEPENDENT_AMBULATORY_CARE_PROVIDER_SITE_OTHER): Payer: Self-pay | Admitting: *Deleted

## 2014-07-07 MED ORDER — SOD PHOS MONO-SOD PHOS DIBASIC 1.102-0.398 G PO TABS: 32.0000 | ORAL_TABLET | Freq: Once | ORAL | Status: DC

## 2014-07-07 NOTE — Telephone Encounter (Signed)
Patient needs osmo pill prep 

## 2014-07-27 ENCOUNTER — Telehealth (INDEPENDENT_AMBULATORY_CARE_PROVIDER_SITE_OTHER): Payer: Self-pay | Admitting: *Deleted

## 2014-07-27 NOTE — Telephone Encounter (Signed)
Referring MD/PCP: vyas   Procedure: tcs  Reason/Indication:  screening  Has patient had this procedure before?  Yes, 2002 -- scanned  If so, when, by whom and where?    Is there a family history of colon cancer?  no  Who?  What age when diagnosed?    Is patient diabetic?   no      Does patient have prosthetic heart valve?  no  Do you have a pacemaker?  no  Has patient ever had endocarditis? no  Has patient had joint replacement within last 12 months?  no  Does patient tend to be constipated or take laxatives? yes  Is patient on Coumadin, Plavix and/or Aspirin? no  Medications: hydrocodone prn  Allergies: pcn, sulfur antibiotics  Medication Adjustment:   Procedure date & time: 08/19/14 at 830

## 2014-07-29 NOTE — Telephone Encounter (Signed)
agree

## 2014-08-19 ENCOUNTER — Encounter (HOSPITAL_COMMUNITY)
Admission: RE | Disposition: A | Discharge status: Home / Self Care | Payer: Self-pay | Source: Ambulatory Visit | Attending: Internal Medicine

## 2014-08-19 ENCOUNTER — Ambulatory Visit (HOSPITAL_COMMUNITY)
Admission: RE | Admit: 2014-08-19 | Discharge: 2014-08-19 | Disposition: A | Discharge status: Home / Self Care | Payer: Medicare Other | Source: Ambulatory Visit | Attending: Internal Medicine | Admitting: Internal Medicine

## 2014-08-19 ENCOUNTER — Encounter (HOSPITAL_COMMUNITY): Payer: Self-pay | Admitting: *Deleted

## 2014-08-19 DIAGNOSIS — K5730 Diverticulosis of large intestine without perforation or abscess without bleeding: Secondary | ICD-10-CM | POA: Diagnosis not present

## 2014-08-19 DIAGNOSIS — Z87891 Personal history of nicotine dependence: Secondary | ICD-10-CM | POA: Insufficient documentation

## 2014-08-19 DIAGNOSIS — Z1211 Encounter for screening for malignant neoplasm of colon: Secondary | ICD-10-CM | POA: Diagnosis not present

## 2014-08-19 HISTORY — PX: COLONOSCOPY: SHX5424

## 2014-08-19 SURGERY — COLONOSCOPY
Anesthesia: Moderate Sedation

## 2014-08-19 MED ORDER — MIDAZOLAM HCL 5 MG/5ML IJ SOLN
INTRAMUSCULAR | Status: DC | PRN
  Administered 2014-08-19 (×3): 2 mg via INTRAVENOUS
  Administered 2014-08-19 (×2): 1 mg via INTRAVENOUS

## 2014-08-19 MED ORDER — MIDAZOLAM HCL 5 MG/5ML IJ SOLN
INTRAMUSCULAR | Status: AC
  Filled 2014-08-19: qty 10

## 2014-08-19 MED ORDER — MEPERIDINE HCL 50 MG/ML IJ SOLN
INTRAMUSCULAR | Status: AC
  Filled 2014-08-19: qty 1

## 2014-08-19 MED ORDER — STERILE WATER FOR IRRIGATION IR SOLN
Status: DC | PRN
  Administered 2014-08-19: 08:00:00

## 2014-08-19 MED ORDER — SODIUM CHLORIDE 0.9 % IV SOLN
INTRAVENOUS | Status: DC
  Administered 2014-08-19: 1000 mL via INTRAVENOUS

## 2014-08-19 MED ORDER — MEPERIDINE HCL 50 MG/ML IJ SOLN
INTRAMUSCULAR | Status: DC | PRN
  Administered 2014-08-19 (×2): 25 mg via INTRAVENOUS

## 2014-08-19 NOTE — Discharge Instructions (Signed)
Resume usual medications and high fiber diet. No driving for 24 hours. Next screening exam in 10 years.  Colonoscopy, Care After Refer to this sheet in the next few weeks. These instructions provide you with information on caring for yourself after your procedure. Your health care provider may also give you more specific instructions. Your treatment has been planned according to current medical practices, but problems sometimes occur. Call your health care provider if you have any problems or questions after your procedure. WHAT TO EXPECT AFTER THE PROCEDURE  After your procedure, it is typical to have the following:  A small amount of blood in your stool.  Moderate amounts of gas and mild abdominal cramping or bloating. HOME CARE INSTRUCTIONS  Do not drive, operate machinery, or sign important documents for 24 hours.  You may shower and resume your regular physical activities, but move at a slower pace for the first 24 hours.  Take frequent rest periods for the first 24 hours.  Walk around or put a warm pack on your abdomen to help reduce abdominal cramping and bloating.  Drink enough fluids to keep your urine clear or pale yellow.  You may resume your normal diet as instructed by your health care provider. Avoid heavy or fried foods that are hard to digest.  Avoid drinking alcohol for 24 hours or as instructed by your health care provider.  Only take over-the-counter or prescription medicines as directed by your health care provider.  If a tissue sample (biopsy) was taken during your procedure:  Do not take aspirin or blood thinners for 7 days, or as instructed by your health care provider.  Do not drink alcohol for 7 days, or as instructed by your health care provider.  Eat soft foods for the first 24 hours. SEEK MEDICAL CARE IF: You have persistent spotting of blood in your stool 2-3 days after the procedure. SEEK IMMEDIATE MEDICAL CARE IF:  You have more than a small  spotting of blood in your stool.  You pass large blood clots in your stool.  Your abdomen is swollen (distended).  You have nausea or vomiting.  You have a fever.  You have increasing abdominal pain that is not relieved with medicine. Document Released: 08/10/2003 Document Revised: 10/16/2012 Document Reviewed: 09/02/2012 [facility] Patient Information 2015 [place], [place]. This information is not intended to replace advice given to you by your health care provider. Make sure you discuss any questions you have with your health care provider.

## 2014-08-19 NOTE — Op Note (Signed)
COLONOSCOPY PROCEDURE REPORT  PATIENT:  [name]  MR#:  [id number] Birthdate:  [birth year], 69 y.o., female Endoscopist:  Dr. Rogene Houston, MD Referred By:  Dr. [name], MD Procedure Date: 08/19/2014  Procedure:   Colonoscopy  Indications:  Patient is 69 year old Caucasian female who is here for average risk screening colonoscopy. Her last exam was in April 2002.  Informed Consent:  The procedure and risks were reviewed with the patient and informed consent was obtained.  Medications:  Demerol 50 mg IV Versed 8 mg IV  Description of procedure:  After a digital rectal exam was performed, that colonoscope was advanced from the anus through the rectum and colon to the area of the cecum, ileocecal valve and appendiceal orifice. The cecum was deeply intubated. These structures were well-seen and photographed for the record. From the level of the cecum and ileocecal valve, the scope was slowly and cautiously withdrawn. The mucosal surfaces were carefully surveyed utilizing scope tip to flexion to facilitate fold flattening as needed. The scope was pulled down into the rectum where a thorough exam including retroflexion was performed.  Findings:   Prep excellent. Redundant hepatic flexure. Few small diverticula noted at ascending and sigmoid colon. Normal rectal mucosa and anal rectal junction.   Therapeutic/Diagnostic Maneuvers Performed:   None  Complications:  None  Cecal Withdrawal Time:  9 minutes  Impression:  Examination performed to cecum. Few diverticula at ascending and sigmoid colon. No evidence of colonic polyps.  Recommendations:  Standard instructions given. High fiber diet. Next screening exam in 10 years.  Donnabelle Blanchard U  08/19/2014 9:01 AM  CC: Dr. [name]., MD & Dr. Rayne Du ref. provider found

## 2014-08-19 NOTE — H&P (Signed)
MERCEDEES CONVERY is an 69 y.o. female.   Chief Complaint: Patient is here for colonoscopy. HPI: Patient is 69 year old Caucasian female who is here for screening colonoscopy. She denies abdominal pain change in bowel habits or rectal bleeding. Last colonoscopy was in April 2002 and no polyps were found. Procedure was performed by Dr. Nelva Bush of [place], [place]. Family history is negative for CRC.  Past Medical History  Diagnosis Date  . No pertinent past medical history   . Medical history non-contributory   . Distal radius fracture, right     Past Surgical History  Procedure Laterality Date  . Cardiac catheterization  2005    cone-report in [place] in epic  . Hemorrhoid surgery    . Cervical fusion  2006  . Tubal ligation    . Colonoscopy    . Finger arthroplasty  9/13    lt long and index  . Tenolysis Left 05/02/2012    Procedure: LEFT LONG TENOLYSIS CAPSULE RELEASES MANIPULATION OF IP/MP JOINTS;  Surgeon: Cammie Sickle., MD;  Location: [place];  Service: Orthopedics;  Laterality: Left;  . Open reduction internal fixation (orif) distal radial fracture Right 06/25/2014    Procedure: OPEN REDUCTION INTERNAL FIXATION (ORIF) RIGHT  DISTAL RADIUS FRACTURE;  Surgeon: Leanora Cover, MD;  Location: [place];  Service: Orthopedics;  Laterality: Right;    Family History  Problem Relation Age of Onset  . Cancer Father    Social History:  reports that she quit smoking about 27 years ago. She does not have any smokeless tobacco history on file. She reports that she does not drink alcohol or use illicit drugs.  Allergies:  Allergies  Allergen Reactions  . Sulfa Antibiotics Rash  . Penicillins Hives    Medications Prior to Admission  Medication Sig Dispense Refill  . HYDROcodone-acetaminophen (NORCO/VICODIN) 5-325 MG per tablet Take 1 tablet by mouth every 6 (six) hours as needed for pain.    . sodium phosphates (OSMOPREP) 1.102-0.398 G TABS Take 32  tablets by mouth once. 32 tablet 0  . vitamin C (ASCORBIC ACID) 500 MG tablet Take 1 tablet (500 mg total) by mouth daily. 30 tablet 0  . HYDROcodone-acetaminophen (NORCO) 5-325 MG per tablet 1-2 tabs po q6 hours prn pain (Patient not taking: Reported on 08/04/2014) 40 tablet 0    No results found for this or any previous visit (from the past 48 hour(s)). No results found.  ROS  Blood pressure 123/71, pulse 64, temperature 97.8 F (36.6 C), temperature source Oral, resp. rate 15, height 5\' 5"  (1.651 m), weight 155 lb (70.308 kg), SpO2 100 %. Physical Exam  Constitutional: She appears well-developed and well-nourished.  HENT:  Mouth/Throat: Oropharynx is clear and moist.  Eyes: Conjunctivae are normal. No scleral icterus.  Neck: No thyromegaly present.  Scar across lower neck to left of midline.  Cardiovascular: Normal rate, regular rhythm and normal heart sounds.   No murmur heard. Respiratory: Effort normal and breath sounds normal.  GI:  Abdomen is symmetrical soft and nontender. Horizontal scar in right low quadrant.  Musculoskeletal: She exhibits no edema.  Lymphadenopathy:    She has no cervical adenopathy.  Neurological: She is alert.  Skin: Skin is warm and dry.     Assessment/Plan Average risk screening colonoscopy.  REHMAN,NAJEEB U 08/19/2014, 8:14 AM

## 2014-08-20 ENCOUNTER — Encounter (HOSPITAL_COMMUNITY): Payer: Self-pay | Admitting: Internal Medicine

## 2015-01-18 DIAGNOSIS — E7800 Pure hypercholesterolemia, unspecified: Secondary | ICD-10-CM | POA: Diagnosis not present

## 2015-01-18 DIAGNOSIS — Z789 Other specified health status: Secondary | ICD-10-CM | POA: Diagnosis not present

## 2015-01-18 DIAGNOSIS — J320 Chronic maxillary sinusitis: Secondary | ICD-10-CM | POA: Diagnosis not present

## 2015-01-18 DIAGNOSIS — Z418 Encounter for other procedures for purposes other than remedying health state: Secondary | ICD-10-CM | POA: Diagnosis not present

## 2015-01-20 DIAGNOSIS — Z880 Allergy status to penicillin: Secondary | ICD-10-CM | POA: Diagnosis not present

## 2015-01-20 DIAGNOSIS — M8580 Other specified disorders of bone density and structure, unspecified site: Secondary | ICD-10-CM | POA: Diagnosis not present

## 2015-01-20 DIAGNOSIS — R109 Unspecified abdominal pain: Secondary | ICD-10-CM | POA: Diagnosis not present

## 2015-01-20 DIAGNOSIS — N200 Calculus of kidney: Secondary | ICD-10-CM | POA: Diagnosis not present

## 2015-01-20 DIAGNOSIS — N201 Calculus of ureter: Secondary | ICD-10-CM | POA: Diagnosis not present

## 2015-01-20 DIAGNOSIS — Z882 Allergy status to sulfonamides status: Secondary | ICD-10-CM | POA: Diagnosis not present

## 2015-01-20 DIAGNOSIS — Z85820 Personal history of malignant melanoma of skin: Secondary | ICD-10-CM | POA: Diagnosis not present

## 2015-01-20 DIAGNOSIS — R1030 Lower abdominal pain, unspecified: Secondary | ICD-10-CM | POA: Diagnosis not present

## 2015-01-21 DIAGNOSIS — Z882 Allergy status to sulfonamides status: Secondary | ICD-10-CM | POA: Diagnosis not present

## 2015-01-21 DIAGNOSIS — N201 Calculus of ureter: Secondary | ICD-10-CM | POA: Diagnosis not present

## 2015-01-21 DIAGNOSIS — Z880 Allergy status to penicillin: Secondary | ICD-10-CM | POA: Diagnosis not present

## 2015-01-21 DIAGNOSIS — M8580 Other specified disorders of bone density and structure, unspecified site: Secondary | ICD-10-CM | POA: Diagnosis not present

## 2015-01-21 DIAGNOSIS — N200 Calculus of kidney: Secondary | ICD-10-CM | POA: Diagnosis not present

## 2015-01-21 DIAGNOSIS — Z85820 Personal history of malignant melanoma of skin: Secondary | ICD-10-CM | POA: Diagnosis not present

## 2015-01-22 DIAGNOSIS — N200 Calculus of kidney: Secondary | ICD-10-CM | POA: Diagnosis not present

## 2015-01-25 DIAGNOSIS — N200 Calculus of kidney: Secondary | ICD-10-CM | POA: Diagnosis not present

## 2015-01-25 DIAGNOSIS — Z960 Presence of urogenital implants: Secondary | ICD-10-CM | POA: Diagnosis not present

## 2015-01-26 DIAGNOSIS — Z79899 Other long term (current) drug therapy: Secondary | ICD-10-CM | POA: Diagnosis not present

## 2015-01-26 DIAGNOSIS — I493 Ventricular premature depolarization: Secondary | ICD-10-CM | POA: Diagnosis not present

## 2015-01-26 DIAGNOSIS — Z883 Allergy status to other anti-infective agents status: Secondary | ICD-10-CM | POA: Diagnosis not present

## 2015-01-26 DIAGNOSIS — Z880 Allergy status to penicillin: Secondary | ICD-10-CM | POA: Diagnosis not present

## 2015-01-26 DIAGNOSIS — Z981 Arthrodesis status: Secondary | ICD-10-CM | POA: Diagnosis not present

## 2015-01-26 DIAGNOSIS — I499 Cardiac arrhythmia, unspecified: Secondary | ICD-10-CM | POA: Diagnosis not present

## 2015-01-26 DIAGNOSIS — N200 Calculus of kidney: Secondary | ICD-10-CM | POA: Diagnosis not present

## 2015-01-26 DIAGNOSIS — Z87442 Personal history of urinary calculi: Secondary | ICD-10-CM | POA: Diagnosis not present

## 2015-01-26 DIAGNOSIS — Z85820 Personal history of malignant melanoma of skin: Secondary | ICD-10-CM | POA: Diagnosis not present

## 2015-01-27 DIAGNOSIS — N200 Calculus of kidney: Secondary | ICD-10-CM | POA: Diagnosis not present

## 2015-01-27 DIAGNOSIS — Z960 Presence of urogenital implants: Secondary | ICD-10-CM | POA: Diagnosis not present

## 2015-01-29 DIAGNOSIS — N200 Calculus of kidney: Secondary | ICD-10-CM | POA: Diagnosis not present

## 2015-01-29 DIAGNOSIS — Z09 Encounter for follow-up examination after completed treatment for conditions other than malignant neoplasm: Secondary | ICD-10-CM | POA: Diagnosis not present

## 2015-03-02 DIAGNOSIS — N3000 Acute cystitis without hematuria: Secondary | ICD-10-CM | POA: Diagnosis not present

## 2015-03-15 DIAGNOSIS — N3000 Acute cystitis without hematuria: Secondary | ICD-10-CM | POA: Diagnosis not present

## 2015-03-25 ENCOUNTER — Other Ambulatory Visit: Payer: Self-pay | Admitting: Otolaryngology

## 2015-03-25 DIAGNOSIS — J342 Deviated nasal septum: Secondary | ICD-10-CM | POA: Diagnosis not present

## 2015-03-25 DIAGNOSIS — R05 Cough: Secondary | ICD-10-CM | POA: Diagnosis not present

## 2015-03-25 DIAGNOSIS — T17908A Unspecified foreign body in respiratory tract, part unspecified causing other injury, initial encounter: Secondary | ICD-10-CM

## 2015-03-25 DIAGNOSIS — R1310 Dysphagia, unspecified: Secondary | ICD-10-CM | POA: Diagnosis not present

## 2015-03-29 ENCOUNTER — Ambulatory Visit
Admission: RE | Admit: 2015-03-29 | Discharge: 2015-03-29 | Disposition: A | Discharge status: Home / Self Care | Payer: Medicare Other | Source: Ambulatory Visit | Attending: Otolaryngology | Admitting: Otolaryngology

## 2015-03-29 DIAGNOSIS — K449 Diaphragmatic hernia without obstruction or gangrene: Secondary | ICD-10-CM | POA: Diagnosis not present

## 2015-03-29 DIAGNOSIS — T17908A Unspecified foreign body in respiratory tract, part unspecified causing other injury, initial encounter: Secondary | ICD-10-CM

## 2015-03-29 DIAGNOSIS — K219 Gastro-esophageal reflux disease without esophagitis: Secondary | ICD-10-CM | POA: Diagnosis not present

## 2015-04-02 ENCOUNTER — Other Ambulatory Visit (HOSPITAL_COMMUNITY): Payer: Self-pay | Admitting: Otolaryngology

## 2015-04-02 DIAGNOSIS — R1310 Dysphagia, unspecified: Secondary | ICD-10-CM

## 2015-04-08 ENCOUNTER — Encounter (HOSPITAL_COMMUNITY): Payer: Medicare Other

## 2015-04-08 ENCOUNTER — Ambulatory Visit (HOSPITAL_COMMUNITY)
Admission: RE | Admit: 2015-04-08 | Discharge: 2015-04-08 | Disposition: A | Discharge status: Home / Self Care | Payer: Medicare Other | Source: Ambulatory Visit | Attending: Otolaryngology | Admitting: Otolaryngology

## 2015-04-08 DIAGNOSIS — X58XXXD Exposure to other specified factors, subsequent encounter: Secondary | ICD-10-CM | POA: Diagnosis not present

## 2015-04-08 DIAGNOSIS — R1310 Dysphagia, unspecified: Secondary | ICD-10-CM | POA: Diagnosis not present

## 2015-04-08 DIAGNOSIS — T17908D Unspecified foreign body in respiratory tract, part unspecified causing other injury, subsequent encounter: Secondary | ICD-10-CM | POA: Diagnosis not present

## 2015-04-08 DIAGNOSIS — K219 Gastro-esophageal reflux disease without esophagitis: Secondary | ICD-10-CM | POA: Diagnosis not present

## 2015-04-12 DIAGNOSIS — N3000 Acute cystitis without hematuria: Secondary | ICD-10-CM | POA: Diagnosis not present

## 2015-04-12 DIAGNOSIS — N209 Urinary calculus, unspecified: Secondary | ICD-10-CM | POA: Diagnosis not present

## 2015-04-12 DIAGNOSIS — R319 Hematuria, unspecified: Secondary | ICD-10-CM | POA: Diagnosis not present

## 2015-04-26 DIAGNOSIS — N200 Calculus of kidney: Secondary | ICD-10-CM | POA: Diagnosis not present

## 2015-05-13 DIAGNOSIS — H40053 Ocular hypertension, bilateral: Secondary | ICD-10-CM | POA: Diagnosis not present

## 2015-05-28 DIAGNOSIS — N3000 Acute cystitis without hematuria: Secondary | ICD-10-CM | POA: Diagnosis not present

## 2015-05-28 DIAGNOSIS — N200 Calculus of kidney: Secondary | ICD-10-CM | POA: Diagnosis not present

## 2015-06-11 DIAGNOSIS — N3000 Acute cystitis without hematuria: Secondary | ICD-10-CM | POA: Diagnosis not present

## 2015-06-11 DIAGNOSIS — N390 Urinary tract infection, site not specified: Secondary | ICD-10-CM | POA: Diagnosis not present

## 2015-06-13 DIAGNOSIS — W01198A Fall on same level from slipping, tripping and stumbling with subsequent striking against other object, initial encounter: Secondary | ICD-10-CM | POA: Diagnosis not present

## 2015-06-13 DIAGNOSIS — Z87891 Personal history of nicotine dependence: Secondary | ICD-10-CM | POA: Diagnosis not present

## 2015-06-13 DIAGNOSIS — M79605 Pain in left leg: Secondary | ICD-10-CM | POA: Diagnosis not present

## 2015-06-13 DIAGNOSIS — S72002A Fracture of unspecified part of neck of left femur, initial encounter for closed fracture: Secondary | ICD-10-CM | POA: Diagnosis not present

## 2015-06-13 DIAGNOSIS — M25552 Pain in left hip: Secondary | ICD-10-CM | POA: Diagnosis not present

## 2015-06-13 DIAGNOSIS — T148 Other injury of unspecified body region: Secondary | ICD-10-CM | POA: Diagnosis not present

## 2015-06-13 DIAGNOSIS — S72142A Displaced intertrochanteric fracture of left femur, initial encounter for closed fracture: Secondary | ICD-10-CM | POA: Diagnosis not present

## 2015-06-14 ENCOUNTER — Observation Stay (HOSPITAL_COMMUNITY): Payer: Medicare Other | Admitting: Certified Registered Nurse Anesthetist

## 2015-06-14 ENCOUNTER — Encounter (HOSPITAL_COMMUNITY)
Admission: AD | Disposition: A | Discharge status: Skilled Nursing Facility | Payer: Self-pay | Source: Other Acute Inpatient Hospital | Attending: Family Medicine

## 2015-06-14 ENCOUNTER — Inpatient Hospital Stay (HOSPITAL_COMMUNITY)
Admission: AD | Admit: 2015-06-14 | Discharge: 2015-06-18 | DRG: 481 | Disposition: A | Discharge status: Skilled Nursing Facility | Payer: Medicare Other | Source: Other Acute Inpatient Hospital | Attending: Family Medicine | Admitting: Family Medicine

## 2015-06-14 ENCOUNTER — Encounter (HOSPITAL_COMMUNITY): Payer: Self-pay | Admitting: Internal Medicine

## 2015-06-14 ENCOUNTER — Observation Stay (HOSPITAL_COMMUNITY): Payer: Medicare Other

## 2015-06-14 DIAGNOSIS — B379 Candidiasis, unspecified: Secondary | ICD-10-CM | POA: Diagnosis not present

## 2015-06-14 DIAGNOSIS — W19XXXA Unspecified fall, initial encounter: Secondary | ICD-10-CM | POA: Diagnosis not present

## 2015-06-14 DIAGNOSIS — S72002A Fracture of unspecified part of neck of left femur, initial encounter for closed fracture: Secondary | ICD-10-CM | POA: Diagnosis present

## 2015-06-14 DIAGNOSIS — N390 Urinary tract infection, site not specified: Secondary | ICD-10-CM | POA: Diagnosis not present

## 2015-06-14 DIAGNOSIS — Y92002 Bathroom of unspecified non-institutional (private) residence single-family (private) house as the place of occurrence of the external cause: Secondary | ICD-10-CM

## 2015-06-14 DIAGNOSIS — D649 Anemia, unspecified: Secondary | ICD-10-CM | POA: Diagnosis not present

## 2015-06-14 DIAGNOSIS — R739 Hyperglycemia, unspecified: Secondary | ICD-10-CM | POA: Diagnosis not present

## 2015-06-14 DIAGNOSIS — M25552 Pain in left hip: Secondary | ICD-10-CM | POA: Diagnosis not present

## 2015-06-14 DIAGNOSIS — Z87442 Personal history of urinary calculi: Secondary | ICD-10-CM

## 2015-06-14 DIAGNOSIS — I2510 Atherosclerotic heart disease of native coronary artery without angina pectoris: Secondary | ICD-10-CM | POA: Diagnosis present

## 2015-06-14 DIAGNOSIS — R319 Hematuria, unspecified: Secondary | ICD-10-CM

## 2015-06-14 DIAGNOSIS — Z87891 Personal history of nicotine dependence: Secondary | ICD-10-CM

## 2015-06-14 DIAGNOSIS — R279 Unspecified lack of coordination: Secondary | ICD-10-CM | POA: Diagnosis not present

## 2015-06-14 DIAGNOSIS — W1830XA Fall on same level, unspecified, initial encounter: Secondary | ICD-10-CM | POA: Diagnosis present

## 2015-06-14 DIAGNOSIS — T402X5A Adverse effect of other opioids, initial encounter: Secondary | ICD-10-CM | POA: Diagnosis not present

## 2015-06-14 DIAGNOSIS — K5903 Drug induced constipation: Secondary | ICD-10-CM | POA: Diagnosis not present

## 2015-06-14 DIAGNOSIS — S72142A Displaced intertrochanteric fracture of left femur, initial encounter for closed fracture: Principal | ICD-10-CM | POA: Diagnosis present

## 2015-06-14 DIAGNOSIS — Z9989 Dependence on other enabling machines and devices: Secondary | ICD-10-CM | POA: Diagnosis not present

## 2015-06-14 DIAGNOSIS — M810 Age-related osteoporosis without current pathological fracture: Secondary | ICD-10-CM | POA: Diagnosis present

## 2015-06-14 DIAGNOSIS — K5900 Constipation, unspecified: Secondary | ICD-10-CM | POA: Diagnosis not present

## 2015-06-14 DIAGNOSIS — Z419 Encounter for procedure for purposes other than remedying health state, unspecified: Secondary | ICD-10-CM

## 2015-06-14 DIAGNOSIS — Y92230 Patient room in hospital as the place of occurrence of the external cause: Secondary | ICD-10-CM | POA: Diagnosis not present

## 2015-06-14 HISTORY — DX: Urinary tract infection, site not specified: N39.0

## 2015-06-14 HISTORY — DX: Hyperglycemia, unspecified: R73.9

## 2015-06-14 HISTORY — DX: Fracture of unspecified part of neck of left femur, initial encounter for closed fracture: S72.002A

## 2015-06-14 HISTORY — PX: INTRAMEDULLARY (IM) NAIL INTERTROCHANTERIC: SHX5875

## 2015-06-14 HISTORY — DX: Calculus of kidney: N20.0

## 2015-06-14 LAB — SURGICAL PCR SCREEN
MRSA, PCR: NEGATIVE
Staphylococcus aureus: NEGATIVE

## 2015-06-14 LAB — PROTIME-INR
INR: 1.19 (ref 0.00–1.49)
Prothrombin Time: 15.3 seconds — ABNORMAL HIGH (ref 11.6–15.2)

## 2015-06-14 SURGERY — FIXATION, FRACTURE, INTERTROCHANTERIC, WITH INTRAMEDULLARY ROD
Anesthesia: General | Laterality: Left

## 2015-06-14 MED ORDER — HYDROMORPHONE HCL 1 MG/ML IJ SOLN
INTRAMUSCULAR | Status: AC
  Administered 2015-06-14: 0.25 mg via INTRAVENOUS
  Filled 2015-06-14: qty 1

## 2015-06-14 MED ORDER — METOCLOPRAMIDE HCL 5 MG PO TABS: 5.0000 mg | ORAL_TABLET | Freq: Three times a day (TID) | ORAL | Status: DC | PRN

## 2015-06-14 MED ORDER — HYDROCODONE-ACETAMINOPHEN 5-325 MG PO TABS
1.0000 | ORAL_TABLET | Freq: Four times a day (QID) | ORAL | Status: DC | PRN
  Administered 2015-06-14: 1 via ORAL
  Administered 2015-06-15 – 2015-06-17 (×8): 2 via ORAL
  Administered 2015-06-18: 1 via ORAL
  Administered 2015-06-18 (×2): 2 via ORAL
  Filled 2015-06-14 (×11): qty 2
  Filled 2015-06-14: qty 1
  Filled 2015-06-14: qty 2

## 2015-06-14 MED ORDER — SODIUM CHLORIDE 0.9 % IV SOLN
INTRAVENOUS | Status: DC
  Administered 2015-06-14 (×2): via INTRAVENOUS

## 2015-06-14 MED ORDER — ONDANSETRON HCL 4 MG PO TABS: 4.0000 mg | ORAL_TABLET | Freq: Four times a day (QID) | ORAL | Status: DC | PRN

## 2015-06-14 MED ORDER — SUCCINYLCHOLINE CHLORIDE 200 MG/10ML IV SOSY
PREFILLED_SYRINGE | INTRAVENOUS | Status: AC
  Filled 2015-06-14: qty 10

## 2015-06-14 MED ORDER — ROCURONIUM BROMIDE 100 MG/10ML IV SOLN
INTRAVENOUS | Status: DC | PRN
  Administered 2015-06-14: 50 mg via INTRAVENOUS

## 2015-06-14 MED ORDER — ONDANSETRON HCL 4 MG/2ML IJ SOLN: 4.0000 mg | Freq: Four times a day (QID) | INTRAMUSCULAR | Status: DC | PRN

## 2015-06-14 MED ORDER — ONDANSETRON HCL 4 MG/2ML IJ SOLN
INTRAMUSCULAR | Status: DC | PRN
  Administered 2015-06-14: 4 mg via INTRAVENOUS

## 2015-06-14 MED ORDER — MEPERIDINE HCL 25 MG/ML IJ SOLN: 6.2500 mg | INTRAMUSCULAR | Status: DC | PRN

## 2015-06-14 MED ORDER — MIDAZOLAM HCL 2 MG/2ML IJ SOLN
INTRAMUSCULAR | Status: AC
  Filled 2015-06-14: qty 2

## 2015-06-14 MED ORDER — SENNOSIDES-DOCUSATE SODIUM 8.6-50 MG PO TABS
1.0000 | ORAL_TABLET | Freq: Every evening | ORAL | Status: DC | PRN
  Administered 2015-06-14 – 2015-06-18 (×3): 1 via ORAL
  Filled 2015-06-14 (×3): qty 1

## 2015-06-14 MED ORDER — LIDOCAINE 2% (20 MG/ML) 5 ML SYRINGE
INTRAMUSCULAR | Status: DC | PRN
  Administered 2015-06-14: 100 mg via INTRAVENOUS

## 2015-06-14 MED ORDER — 0.9 % SODIUM CHLORIDE (POUR BTL) OPTIME
TOPICAL | Status: DC | PRN
  Administered 2015-06-14: 1000 mL

## 2015-06-14 MED ORDER — SODIUM CHLORIDE 0.9 % IV SOLN: INTRAVENOUS | Status: DC

## 2015-06-14 MED ORDER — ACETAMINOPHEN 500 MG PO TABS: 500.0000 mg | ORAL_TABLET | Freq: Four times a day (QID) | ORAL | Status: DC | PRN

## 2015-06-14 MED ORDER — MORPHINE SULFATE (PF) 2 MG/ML IV SOLN
0.5000 mg | INTRAVENOUS | Status: DC | PRN
  Administered 2015-06-15 (×2): 0.5 mg via INTRAVENOUS
  Filled 2015-06-14 (×2): qty 1

## 2015-06-14 MED ORDER — GLYCOPYRROLATE 0.2 MG/ML IV SOSY
PREFILLED_SYRINGE | INTRAVENOUS | Status: AC
  Filled 2015-06-14: qty 3

## 2015-06-14 MED ORDER — ACETAMINOPHEN 325 MG PO TABS: 650.0000 mg | ORAL_TABLET | Freq: Four times a day (QID) | ORAL | Status: DC | PRN

## 2015-06-14 MED ORDER — LIDOCAINE 2% (20 MG/ML) 5 ML SYRINGE
INTRAMUSCULAR | Status: AC
  Filled 2015-06-14: qty 5

## 2015-06-14 MED ORDER — PROPOFOL 10 MG/ML IV BOLUS
INTRAVENOUS | Status: DC | PRN
  Administered 2015-06-14: 120 mg via INTRAVENOUS

## 2015-06-14 MED ORDER — PROPOFOL 10 MG/ML IV BOLUS
INTRAVENOUS | Status: AC
  Filled 2015-06-14: qty 20

## 2015-06-14 MED ORDER — PHENOL 1.4 % MT LIQD: 1.0000 | OROMUCOSAL | Status: DC | PRN

## 2015-06-14 MED ORDER — HYDROMORPHONE HCL 1 MG/ML IJ SOLN
INTRAMUSCULAR | Status: AC
  Administered 2015-06-14: 0.5 mg via INTRAVENOUS
  Filled 2015-06-14: qty 1

## 2015-06-14 MED ORDER — SUGAMMADEX SODIUM 200 MG/2ML IV SOLN
INTRAVENOUS | Status: DC | PRN
  Administered 2015-06-14: 200 mg via INTRAVENOUS

## 2015-06-14 MED ORDER — HYDROMORPHONE HCL 1 MG/ML IJ SOLN
0.2500 mg | INTRAMUSCULAR | Status: DC | PRN
  Administered 2015-06-14: 0.5 mg via INTRAVENOUS
  Administered 2015-06-14 (×3): 0.25 mg via INTRAVENOUS

## 2015-06-14 MED ORDER — MIDAZOLAM HCL 5 MG/5ML IJ SOLN
INTRAMUSCULAR | Status: DC | PRN
  Administered 2015-06-14 (×2): 1 mg via INTRAVENOUS

## 2015-06-14 MED ORDER — BISACODYL 10 MG RE SUPP
10.0000 mg | Freq: Every day | RECTAL | Status: DC | PRN
Start: 2015-06-14 — End: 2015-06-18
  Filled 2015-06-14: qty 1

## 2015-06-14 MED ORDER — ROCURONIUM BROMIDE 50 MG/5ML IV SOLN
INTRAVENOUS | Status: AC
  Filled 2015-06-14: qty 1

## 2015-06-14 MED ORDER — CLINDAMYCIN PHOSPHATE 600 MG/50ML IV SOLN
600.0000 mg | Freq: Four times a day (QID) | INTRAVENOUS | Status: AC
  Administered 2015-06-14 – 2015-06-15 (×2): 600 mg via INTRAVENOUS
  Filled 2015-06-14 (×3): qty 50

## 2015-06-14 MED ORDER — ASPIRIN EC 325 MG PO TBEC
325.0000 mg | DELAYED_RELEASE_TABLET | Freq: Every day | ORAL | Status: DC
  Administered 2015-06-15 – 2015-06-18 (×4): 325 mg via ORAL
  Filled 2015-06-14 (×4): qty 1

## 2015-06-14 MED ORDER — ONDANSETRON HCL 4 MG/2ML IJ SOLN: 4.0000 mg | Freq: Once | INTRAMUSCULAR | Status: DC | PRN

## 2015-06-14 MED ORDER — MENTHOL 3 MG MT LOZG: 1.0000 | LOZENGE | OROMUCOSAL | Status: DC | PRN

## 2015-06-14 MED ORDER — HYDROCODONE-ACETAMINOPHEN 5-325 MG PO TABS
1.0000 | ORAL_TABLET | Freq: Four times a day (QID) | ORAL | Status: DC | PRN
  Administered 2015-06-14: 2 via ORAL
  Filled 2015-06-14: qty 2

## 2015-06-14 MED ORDER — METOCLOPRAMIDE HCL 5 MG/ML IJ SOLN: 5.0000 mg | Freq: Three times a day (TID) | INTRAMUSCULAR | Status: DC | PRN

## 2015-06-14 MED ORDER — PHENYLEPHRINE 40 MCG/ML (10ML) SYRINGE FOR IV PUSH (FOR BLOOD PRESSURE SUPPORT)
PREFILLED_SYRINGE | INTRAVENOUS | Status: AC
  Filled 2015-06-14: qty 10

## 2015-06-14 MED ORDER — ASPIRIN EC 81 MG PO TBEC: 81.0000 mg | DELAYED_RELEASE_TABLET | Freq: Every day | ORAL | Status: AC

## 2015-06-14 MED ORDER — LACTATED RINGERS IV SOLN
INTRAVENOUS | Status: DC
  Administered 2015-06-14: 50 mL/h via INTRAVENOUS
  Administered 2015-06-14: 18:00:00 via INTRAVENOUS

## 2015-06-14 MED ORDER — DEXAMETHASONE SODIUM PHOSPHATE 10 MG/ML IJ SOLN
INTRAMUSCULAR | Status: DC | PRN
  Administered 2015-06-14: 10 mg via INTRAVENOUS

## 2015-06-14 MED ORDER — CLINDAMYCIN PHOSPHATE 900 MG/50ML IV SOLN
900.0000 mg | INTRAVENOUS | Status: AC
  Administered 2015-06-14: 900 mg via INTRAVENOUS
  Filled 2015-06-14: qty 50

## 2015-06-14 MED ORDER — ACETAMINOPHEN 650 MG RE SUPP: 650.0000 mg | Freq: Four times a day (QID) | RECTAL | Status: DC | PRN

## 2015-06-14 MED ORDER — FENTANYL CITRATE (PF) 250 MCG/5ML IJ SOLN
INTRAMUSCULAR | Status: DC | PRN
  Administered 2015-06-14: 150 ug via INTRAVENOUS
  Administered 2015-06-14: 50 ug via INTRAVENOUS

## 2015-06-14 MED ORDER — EPHEDRINE 5 MG/ML INJ
INTRAVENOUS | Status: AC
  Filled 2015-06-14: qty 10

## 2015-06-14 MED ORDER — MORPHINE SULFATE (PF) 2 MG/ML IV SOLN
0.5000 mg | INTRAVENOUS | Status: DC | PRN
  Administered 2015-06-14 (×2): 0.5 mg via INTRAVENOUS
  Filled 2015-06-14 (×2): qty 1

## 2015-06-14 MED ORDER — POVIDONE-IODINE 10 % EX SWAB
2.0000 "application " | Freq: Once | CUTANEOUS | Status: AC
  Administered 2015-06-14: 2 via TOPICAL

## 2015-06-14 MED ORDER — FENTANYL CITRATE (PF) 250 MCG/5ML IJ SOLN
INTRAMUSCULAR | Status: AC
  Filled 2015-06-14: qty 5

## 2015-06-14 SURGICAL SUPPLY — 34 items
BIT DRILL FLUTED FEMUR 4.2/3 (BIT) ×3 IMPLANT
BLADE HELICAL TFNA 95 STRL (Anchor) ×2 IMPLANT
BLADE HELICAL TFNA 95MM STRL (Anchor) ×1 IMPLANT
BLADE SURG 15 STRL LF DISP TIS (BLADE) ×1 IMPLANT
BLADE SURG 15 STRL SS (BLADE) ×3
COVER PERINEAL POST (MISCELLANEOUS) ×3 IMPLANT
COVER SURGICAL LIGHT HANDLE (MISCELLANEOUS) ×6 IMPLANT
COVER TABLE BACK 60X90 (DRAPES) ×3 IMPLANT
DRAPE C-ARM 42X72 X-RAY (DRAPES) ×3 IMPLANT
DRAPE STERI IOBAN 125X83 (DRAPES) ×3 IMPLANT
DRSG ADAPTIC 3X8 NADH LF (GAUZE/BANDAGES/DRESSINGS) IMPLANT
DRSG MEPILEX BORDER 4X4 (GAUZE/BANDAGES/DRESSINGS) ×3 IMPLANT
DRSG MEPILEX BORDER 4X8 (GAUZE/BANDAGES/DRESSINGS) ×3 IMPLANT
ELECT REM PT RETURN 9FT ADLT (ELECTROSURGICAL) ×3
ELECTRODE REM PT RTRN 9FT ADLT (ELECTROSURGICAL) ×1 IMPLANT
EVACUATOR 1/8 PVC DRAIN (DRAIN) IMPLANT
GLOVE BIOGEL PI IND STRL 9 (GLOVE) ×1 IMPLANT
GLOVE BIOGEL PI INDICATOR 9 (GLOVE) ×2
GLOVE SURG ORTHO 9.0 STRL STRW (GLOVE) ×3 IMPLANT
GOWN STRL REUS W/ TWL XL LVL3 (GOWN DISPOSABLE) ×3 IMPLANT
GOWN STRL REUS W/TWL XL LVL3 (GOWN DISPOSABLE) ×9
GUIDEWIRE 3.2X400 (WIRE) ×3 IMPLANT
KIT BASIN OR (CUSTOM PROCEDURE TRAY) ×3 IMPLANT
KIT ROOM TURNOVER OR (KITS) ×3 IMPLANT
LINER BOOT UNIVERSAL DISP (MISCELLANEOUS) ×3 IMPLANT
MANIFOLD NEPTUNE II (INSTRUMENTS) ×3 IMPLANT
NAIL TROCH FIX 10X170 130 (Nail) ×3 IMPLANT
NS IRRIG 1000ML POUR BTL (IV SOLUTION) ×3 IMPLANT
PACK GENERAL/GYN (CUSTOM PROCEDURE TRAY) ×3 IMPLANT
PAD ARMBOARD 7.5X6 YLW CONV (MISCELLANEOUS) ×6 IMPLANT
SCREW LOCKING 5.0X34MM (Screw) ×3 IMPLANT
STAPLER VISISTAT 35W (STAPLE) IMPLANT
SUT VIC AB 2-0 CTB1 (SUTURE) IMPLANT
WATER STERILE IRR 1000ML POUR (IV SOLUTION) ×6 IMPLANT

## 2015-06-14 NOTE — Consult Note (Signed)
ORTHOPAEDIC CONSULTATION  REQUESTING PHYSICIAN: Ivor Costa, MD  Chief Complaint: Left hip pain  HPI: [name] is a 70 y.o. female who presents with left intertrochanteric hip fracture. Patient states he was outside had wet grass on her feet went into the bathroom slipped landing on her left hip sustaining the left intertrochanteric hip fracture.  Past Medical History  Diagnosis Date  . No pertinent past medical history   . Medical history non-contributory   . Distal radius fracture, right    Past Surgical History  Procedure Laterality Date  . Cardiac catheterization  2005    cone-report in [place] in epic  . Hemorrhoid surgery    . Cervical fusion  2006  . Tubal ligation    . Colonoscopy    . Finger arthroplasty  9/13    lt long and index  . Tenolysis Left 05/02/2012    Procedure: LEFT LONG TENOLYSIS CAPSULE RELEASES MANIPULATION OF IP/MP JOINTS;  Surgeon: Cammie Sickle., MD;  Location: [place];  Service: Orthopedics;  Laterality: Left;  . Open reduction internal fixation (orif) distal radial fracture Right 06/25/2014    Procedure: OPEN REDUCTION INTERNAL FIXATION (ORIF) RIGHT  DISTAL RADIUS FRACTURE;  Surgeon: Leanora Cover, MD;  Location: [place];  Service: Orthopedics;  Laterality: Right;  . Colonoscopy N/A 08/19/2014    Procedure: COLONOSCOPY;  Surgeon: Rogene Houston, MD;  [facility];  Service: Endoscopy;  Laterality: N/A;  830   Social History   Social History  . Marital Status: Single    Spouse Name: N/A  . Number of Children: N/A  . Years of Education: N/A   Social History Main Topics  . Smoking status: Former Smoker    Quit date: 10/03/1986  . Smokeless tobacco: Not on file  . Alcohol Use: No  . Drug Use: No  . Sexual Activity: Not on file   Other Topics Concern  . Not on file   Social History Narrative   Family History  Problem Relation Age of Onset  . Cancer Father    - negative except  otherwise stated in the family history section Allergies  Allergen Reactions  . Sulfa Antibiotics Rash  . Penicillins Hives   Prior to Admission medications   Medication Sig Start Date End Date Taking? Authorizing Provider  HYDROcodone-acetaminophen (NORCO/VICODIN) 5-325 MG per tablet Take 1 tablet by mouth every 6 (six) hours as needed for pain.    Historical Provider, MD  vitamin C (ASCORBIC ACID) 500 MG tablet Take 1 tablet (500 mg total) by mouth daily. 06/25/14   Leanora Cover, MD   No results found. - pertinent xrays, CT, MRI studies were reviewed and independently interpreted  Positive ROS: All other systems have been reviewed and were otherwise negative with the exception of those mentioned in the HPI and as above.  Physical Exam: General: Alert, no acute distress Cardiovascular: No pedal edema Respiratory: No cyanosis, no use of accessory musculature GI: No organomegaly, abdomen is soft and non-tender Skin: No lesions in the area of chief complaint Neurologic: Sensation intact distally Psychiatric: Patient is competent for consent with normal mood and affect Lymphatic: No axillary or cervical lymphadenopathy  MUSCULOSKELETAL:  On examination patient's left lower extremity is shortened and externally rotated. She has pain with movement the lower extremities neurovascularly intact. Radiographs shows a comminuted intertrochanteric left hip fracture.  Assessment: Assessment: Comminuted left intertrochanteric hip fracture.  Plan: Plan: We'll plan for trochanteric nail fixation. Risks  and benefits were discussed including infection neurovascular injury persistent pain arthritis need for additional surgery. Patient states she understands wish to proceed at this time of note patient is currently on medications for a couple occasions from kidney stone treatment.  Thank you for the consult and the opportunity to see Ms. Alyssa Caller, MD [facility] [phone] 7:03 AM

## 2015-06-14 NOTE — Progress Notes (Signed)
Dr. Sharol Given at the bedside.

## 2015-06-14 NOTE — Progress Notes (Signed)
Report called to Robbie-RN in Short Stay. All questions/concerns addressed. Will continue to monitor

## 2015-06-14 NOTE — Progress Notes (Signed)
Paged Dr. Blaine Hamper for admission orders. Awaiting orders.

## 2015-06-14 NOTE — Anesthesia Procedure Notes (Signed)
Procedure Name: Intubation Date/Time: 06/14/2015 4:57 PM Performed by: Layla Maw Pre-anesthesia Checklist: Patient identified, Patient being monitored, Timeout performed, Emergency Drugs available and Suction available Patient Re-evaluated:Patient Re-evaluated prior to inductionOxygen Delivery Method: Circle System Utilized Preoxygenation: Pre-oxygenation with 100% oxygen Intubation Type: IV induction Ventilation: Mask ventilation without difficulty and Oral airway inserted - appropriate to patient size Laryngoscope Size: Mac and 4 Grade View: Grade IV Tube type: Oral Tube size: 7.5 mm Number of attempts: 3 Airway Equipment and Method: Stylet Placement Confirmation: ETT inserted through vocal cords under direct vision,  positive ETCO2 and breath sounds checked- equal and bilateral Secured at: 21 cm Tube secured with: Tape Dental Injury: Teeth and Oropharynx as per pre-operative assessment  Difficulty Due To: Difficulty was anticipated and Difficult Airway- due to anterior larynx Comments: First DL by Ronny Bacon CRNA with grade 3 view and unable to pass ETT. Second DL by Dr. Conrad [place] with esophageal intubation. Immediately recognized and removed. 3rd DL by Dr. Conrad [place] resulted in [place]. Would recommend glidescope d/t very anterior larynx and minimal TM distance.

## 2015-06-14 NOTE — H&P (Signed)
[facility] History and Physical  [name] [id number] DOB: [birth year] DOA: 06/14/2015  Referring physician: Erlinda Hong PCP: [name], MD   Chief Complaint: left hip pain  HPI: [name] is a pleasant 70 y.o. female with past medical history that includes recent UTI, kidney stones presents to [facility] room 10 on 5 N. from [place] with the chief complaint left hip pain after a mechanical fall. Initial evaluation at [facility] reveals a left hip fracture and she was transported to [facility].  Information is obtained from the patient and her family is at the bedside. She recalls coming in from outdoors in bare feet and went to wash the grass off her feet during which time she slipped and fell onto her left side. She reports feeling immediate pain. She reports being unable to get up. She denies any her head or any loss of consciousness. She denies any chest pain palpitation headache dizziness syncope or near-syncope. She does report being on antibiotics for urinary tract infection. Currently she denies dysuria hematuria frequency but does endorse some urgency. She denies abdominal pain nausea vomiting fever chills recent travel or sick contacts.  Upon arrival she is afebrile hemodynamically stable and not hypoxic.   Review of Systems:  10 point review of systems complete and all systems are negative except as indicated in the history of present illness  Past Medical History  Diagnosis Date  . No pertinent past medical history   . Medical history non-contributory   . Distal radius fracture, right   . Hip fracture, left ([place])     2017  . UTI (lower urinary tract infection)     06/2015  . Kidney stone     5.2017   Past Surgical History  Procedure Laterality Date  . Cardiac catheterization  2005    cone-report in [place] in epic  . Hemorrhoid surgery    . Cervical fusion  2006  . Tubal ligation    . Colonoscopy    . Finger arthroplasty  9/13    lt long and index    . Tenolysis Left 05/02/2012    Procedure: LEFT LONG TENOLYSIS CAPSULE RELEASES MANIPULATION OF IP/MP JOINTS;  Surgeon: Cammie Sickle., MD;  Location: [place];  Service: Orthopedics;  Laterality: Left;  . Open reduction internal fixation (orif) distal radial fracture Right 06/25/2014    Procedure: OPEN REDUCTION INTERNAL FIXATION (ORIF) RIGHT  DISTAL RADIUS FRACTURE;  Surgeon: Leanora Cover, MD;  Location: [place];  Service: Orthopedics;  Laterality: Right;  . Colonoscopy N/A 08/19/2014    Procedure: COLONOSCOPY;  Surgeon: Rogene Houston, MD;  [facility];  Service: Endoscopy;  Laterality: N/A;  830   Social History:  reports that she quit smoking about 28 years ago. She does not have any smokeless tobacco history on file. She reports that she does not drink alcohol or use illicit drugs. She lives at home with her husband she is currently employed full-time as a Radiation protection practitioner for [employer]. She ambulates independently Allergies  Allergen Reactions  . Sulfa Antibiotics Rash  . Penicillins Hives    Family History  Problem Relation Age of Onset  . Cancer Father    Father deceased of "brain tumor". Mother is alive and has high blood pressure. She has 3 siblings whose collective medical history is positive for heart disease and high blood pressure. There is no cancer noted  Prior to Admission medications   Medication Sig  Start Date End Date Taking? Authorizing Provider  HYDROcodone-acetaminophen (NORCO/VICODIN) 5-325 MG per tablet Take 1 tablet by mouth every 6 (six) hours as needed for pain.    Historical Provider, MD  vitamin C (ASCORBIC ACID) 500 MG tablet Take 1 tablet (500 mg total) by mouth daily. 06/25/14   Leanora Cover, MD   Physical Exam: Filed Vitals:   06/14/15 0628  BP: 109/65  Pulse: 83  Temp: 97.7 F (36.5 C)  TempSrc: Oral  Resp: 16  Weight: 81.8 kg (180 lb 5.4 oz)  SpO2: 92%    Wt Readings from  Last 3 Encounters:  06/14/15 81.8 kg (180 lb 5.4 oz)  08/19/14 70.308 kg (155 lb)  06/25/14 72.122 kg (159 lb)    General:  Appears calm and Somewhat uncomfortable Eyes: PERRL, normal lids, irises & conjunctiva ENT: grossly normal hearing, lips & tongue, his membranes of her mouth are pink and dry Neck: no LAD, masses or thyromegaly Cardiovascular: RRR, no m/r/g. No LE edema. Pedal pulses are present and palpable. Bilateral feet cool to the touch  Respiratory: CTA bilaterally, no w/r/r. Normal respiratory effort. Abdomen: soft, ntnd positive bowel sounds but somewhat sluggish no guarding or rebounding Skin: no rash or induration seen on limited exam Musculoskeletal: grossly normal tone BUE/BLE, left leg externally rotated and somewhat shorter than the right. Left hip decreased range of motion due to pain Psychiatric: grossly normal mood and affect, speech fluent and appropriate Neurologic: grossly non-focal. speech clear facial symmetry oriented to person place and time           Labs on Admission:  Basic Metabolic Panel: No results for input(s): NA, K, CL, CO2, GLUCOSE, BUN, CREATININE, CALCIUM, MG, PHOS in the last 168 hours. Liver Function Tests: No results for input(s): AST, ALT, ALKPHOS, BILITOT, PROT, ALBUMIN in the last 168 hours. No results for input(s): LIPASE, AMYLASE in the last 168 hours. No results for input(s): AMMONIA in the last 168 hours. CBC: No results for input(s): WBC, NEUTROABS, HGB, HCT, MCV, PLT in the last 168 hours. Cardiac Enzymes: No results for input(s): CKTOTAL, CKMB, CKMBINDEX, TROPONINI in the last 168 hours.  BNP (last 3 results) No results for input(s): BNP in the last 8760 hours.  ProBNP (last 3 results) No results for input(s): PROBNP in the last 8760 hours.  CBG: No results for input(s): GLUCAP in the last 168 hours.  Radiological Exams on Admission: No results found.  EKG: Independently reviewed normal sinus  rhythm  Assessment/Plan Active Problems:   Intertrochanteric fracture of left hip (HCC)   UTI (urinary tract infection)   Hyperglycemia  #1. Intertrochanteric fracture of left hip status post mechanical fall.  X-ray taken at [facility] reveals acute comminuted intertrochanteric fracture of the left hip. She is hemodynamically stable and nontoxic appearing. Initial workup at [facility] reveals WBCs 12.6, serum glucose 141 otherwise lab work unremarkable. EKG as noted above. Chest x-ray reveals no active disease. Urinalysis unremarkable. She was evaluated by orthopedic surgery upon arrival plan for trochanteric nail fixation this afternoon. -Admit to MedSurg -Nothing by mouth -Pain management -SCDs -IV fluids -Continue Foley  #2. UTI. Urinalysis on admission unremarkable. Home medications include nitrofurantin. He has completed half of the course -Foley catheter -Monitor  #3. Hyperglycemia. Serum glucose 141. Anion gap 17.  No hx diabetes -cbg q 4 for tracking  -iv fluids as noted above -npo for now awaiting surgery -a1c -may need OP follow up    Code Status: full DVT Prophylaxis: Family Communication: husband and  daughter Disposition Plan: home when ready  Time spent: 40 minutes  [facility]

## 2015-06-14 NOTE — Op Note (Signed)
06/14/2015  5:24 PM  PATIENT:  [name]    PRE-OPERATIVE DIAGNOSIS:  Left Intertrochanteric Fracture  POST-OPERATIVE DIAGNOSIS:  Same  PROCEDURE:  INTRAMEDULLARY (IM) NAIL INTERTROCHANTRIC  SURGEON:  Livian Vanderbeck V, MD  PHYSICIAN ASSISTANT:None ANESTHESIA:   General  PREOPERATIVE INDICATIONS:  [name] is a  70 y.o. female with a diagnosis of Left Intertrochanteric Fracture who failed conservative measures and elected for surgical management.    The risks benefits and alternatives were discussed with the patient preoperatively including but not limited to the risks of infection, bleeding, nerve injury, cardiopulmonary complications, the need for revision surgery, among others, and the patient was willing to proceed.  OPERATIVE IMPLANTS: Synthes trochanteric nail 170 x 10 mm with 95 mm spiral blade and 34 mm compression screw  OPERATIVE FINDINGS: Soft osteoporotic bone  OPERATIVE PROCEDURE: Patient was brought the operating room and underwent a general anesthetic. After adequate levels anesthesia obtained patient's placed supine on the [facility] fracture table the right lower extremity was strapped to the long cross member of the bed with foam padding the left lower extremity was placed in boot traction after well padded and reduction was performed C-arm fluoroscopy verified reduction. The left lower extremity was then prepped using DuraPrep draped into a sterile field a timeout was called. A incision was made just proximal to the greater trochanter. A guidewire was inserted directly onto the greater trochanter and this was inserted down the shaft. C-arm fluoroscopy verified alignment. This was overreamed the nail was inserted using the guide the spiral blade screw was then placed over a wire center center in the femoral head the distal interlocking screw was placed 34 mm in diameter and length the spiral blade was 95 mm in length. C-arm floss be verified alignment. The wounds were  irrigated with normal saline subcutaneous is closed using 0 Vicryl skin was closed using staples and Mepilex dressing was applied. Patient was x-rayed taken to the PACU in stable condition.

## 2015-06-14 NOTE — Anesthesia Postprocedure Evaluation (Signed)
Anesthesia Post Note  Patient: [name]  Procedure(s) Performed: Procedure(s) (LRB): INTRAMEDULLARY (IM) NAIL INTERTROCHANTRIC (Left)  Patient location during evaluation: PACU Anesthesia Type: General Level of consciousness: awake and alert Pain management: pain level controlled Vital Signs Assessment: post-procedure vital signs reviewed and stable Respiratory status: spontaneous breathing, nonlabored ventilation, respiratory function stable and patient connected to nasal cannula oxygen Cardiovascular status: blood pressure returned to baseline and stable Postop Assessment: no signs of nausea or vomiting Anesthetic complications: no    Last Vitals:  Filed Vitals:   06/14/15 1851 06/14/15 1906  BP: 125/68 125/66  Pulse: 81 83  Temp:    Resp: 13 10    Last Pain:  Filed Vitals:   06/14/15 1921  PainSc: 2                  Laketra Bowdish DAVID

## 2015-06-14 NOTE — Progress Notes (Signed)
This is a no charge note  Transfer from [facility] per Dr. Myriam Forehand  70 year old lady with past medical history of CAD and currently being treated for yeast infection, who presents with left hip pain after mechanical fall. Found to have left hip fracture. Hemodynamically stable. Orthopedic surgeon, Dr. Sharol Given was consulted by EDP, planning to do surgery (not know when). Pt is accepted to med-surg bed as inpt.  Ivor Costa, MD  [facility] Pager [phone]  If 7PM-7AM, please contact night-coverage [email or web address] Password [email or web address] 06/14/2015, 2:28 AM

## 2015-06-14 NOTE — Transfer of Care (Signed)
Immediate Anesthesia Transfer of Care Note  Patient: [name]  Procedure(s) Performed: Procedure(s): INTRAMEDULLARY (IM) NAIL INTERTROCHANTRIC (Left)  Patient Location: PACU  Anesthesia Type:General  Level of Consciousness: awake, alert , oriented and patient cooperative  Airway & Oxygen Therapy: Patient Spontanous Breathing and Patient connected to nasal cannula oxygen  Post-op Assessment: Report given to RN, Post -op Vital signs reviewed and stable and Patient moving all extremities X 4  Post vital signs: Reviewed and stable  Last Vitals:  Filed Vitals:   06/14/15 0628  BP: 109/65  Pulse: 83  Temp: 36.5 C  Resp: 16    Last Pain:  Filed Vitals:   06/14/15 1751  PainSc: 9          Complications: No apparent anesthesia complications

## 2015-06-14 NOTE — Anesthesia Preprocedure Evaluation (Addendum)
Anesthesia Evaluation  Patient identified by MRN, date of birth, ID band Patient awake    Reviewed: Allergy & Precautions, NPO status , Patient's Chart, lab work & pertinent test results  History of Anesthesia Complications Negative for: history of anesthetic complications  Airway Mallampati: II  TM Distance: <3 FB Neck ROM: Full    Dental  (+) Teeth Intact, Dental Advisory Given   Pulmonary Recent URI , Residual Cough, former smoker,    Pulmonary exam normal breath sounds clear to auscultation       Cardiovascular Exercise Tolerance: Good Normal cardiovascular exam Rhythm:Regular Rate:Normal     Neuro/Psych negative neurological ROS  negative psych ROS   GI/Hepatic   Endo/Other    Renal/GU Renal disease Kidney stone      Musculoskeletal negative musculoskeletal ROS (+)   Abdominal (+)  Abdomen: soft. Bowel sounds: normal.  Peds  Hematology   Anesthesia Other Findings   Reproductive/Obstetrics negative OB ROS                           BP Readings from Last 3 Encounters:  06/14/15 109/65  08/19/14 90/54  06/25/14 103/44   Lab Results  Component Value Date   HGB 12.1 05/02/2012     Chemistry   No results found for: NA, K, CL, CO2, BUN, CREATININE, GLU No results found for: CALCIUM, ALKPHOS, AST, ALT, BILITOT    Anesthesia Physical Anesthesia Plan  ASA: II  Anesthesia Plan: General   Post-op Pain Management:    Induction: Intravenous  Airway Management Planned: Oral ETT  Additional Equipment:   Intra-op Plan:   Post-operative Plan: Extubation in OR  Informed Consent: I have reviewed the patients History and Physical, chart, labs and discussed the procedure including the risks, benefits and alternatives for the proposed anesthesia with the patient or authorized representative who has indicated his/her understanding and acceptance.   Dental advisory given  Plan  Discussed with: CRNA and Surgeon  Anesthesia Plan Comments:        Anesthesia Quick Evaluation

## 2015-06-15 DIAGNOSIS — N390 Urinary tract infection, site not specified: Secondary | ICD-10-CM | POA: Diagnosis not present

## 2015-06-15 DIAGNOSIS — R739 Hyperglycemia, unspecified: Secondary | ICD-10-CM | POA: Diagnosis not present

## 2015-06-15 DIAGNOSIS — R293 Abnormal posture: Secondary | ICD-10-CM | POA: Diagnosis not present

## 2015-06-15 DIAGNOSIS — B379 Candidiasis, unspecified: Secondary | ICD-10-CM | POA: Diagnosis present

## 2015-06-15 DIAGNOSIS — I2510 Atherosclerotic heart disease of native coronary artery without angina pectoris: Secondary | ICD-10-CM | POA: Diagnosis present

## 2015-06-15 DIAGNOSIS — Z4789 Encounter for other orthopedic aftercare: Secondary | ICD-10-CM | POA: Diagnosis not present

## 2015-06-15 DIAGNOSIS — S72002D Fracture of unspecified part of neck of left femur, subsequent encounter for closed fracture with routine healing: Secondary | ICD-10-CM | POA: Diagnosis not present

## 2015-06-15 DIAGNOSIS — M810 Age-related osteoporosis without current pathological fracture: Secondary | ICD-10-CM | POA: Diagnosis present

## 2015-06-15 DIAGNOSIS — Z87891 Personal history of nicotine dependence: Secondary | ICD-10-CM | POA: Diagnosis not present

## 2015-06-15 DIAGNOSIS — D649 Anemia, unspecified: Secondary | ICD-10-CM | POA: Diagnosis not present

## 2015-06-15 DIAGNOSIS — Y92230 Patient room in hospital as the place of occurrence of the external cause: Secondary | ICD-10-CM | POA: Diagnosis not present

## 2015-06-15 DIAGNOSIS — S72009A Fracture of unspecified part of neck of unspecified femur, initial encounter for closed fracture: Secondary | ICD-10-CM | POA: Diagnosis not present

## 2015-06-15 DIAGNOSIS — Y92002 Bathroom of unspecified non-institutional (private) residence single-family (private) house as the place of occurrence of the external cause: Secondary | ICD-10-CM | POA: Diagnosis not present

## 2015-06-15 DIAGNOSIS — M6281 Muscle weakness (generalized): Secondary | ICD-10-CM | POA: Diagnosis not present

## 2015-06-15 DIAGNOSIS — T402X5A Adverse effect of other opioids, initial encounter: Secondary | ICD-10-CM | POA: Diagnosis not present

## 2015-06-15 DIAGNOSIS — W1830XA Fall on same level, unspecified, initial encounter: Secondary | ICD-10-CM | POA: Diagnosis present

## 2015-06-15 DIAGNOSIS — Z87442 Personal history of urinary calculi: Secondary | ICD-10-CM | POA: Diagnosis not present

## 2015-06-15 DIAGNOSIS — K5903 Drug induced constipation: Secondary | ICD-10-CM | POA: Diagnosis not present

## 2015-06-15 DIAGNOSIS — Z9181 History of falling: Secondary | ICD-10-CM | POA: Diagnosis not present

## 2015-06-15 DIAGNOSIS — K5900 Constipation, unspecified: Secondary | ICD-10-CM | POA: Diagnosis not present

## 2015-06-15 DIAGNOSIS — S72142D Displaced intertrochanteric fracture of left femur, subsequent encounter for closed fracture with routine healing: Secondary | ICD-10-CM | POA: Diagnosis not present

## 2015-06-15 DIAGNOSIS — R262 Difficulty in walking, not elsewhere classified: Secondary | ICD-10-CM | POA: Diagnosis not present

## 2015-06-15 DIAGNOSIS — S72142A Displaced intertrochanteric fracture of left femur, initial encounter for closed fracture: Secondary | ICD-10-CM | POA: Diagnosis not present

## 2015-06-15 LAB — BASIC METABOLIC PANEL
Anion gap: 5 (ref 5–15)
BUN: 8 mg/dL (ref 6–20)
CALCIUM: 8.3 mg/dL — AB (ref 8.9–10.3)
CHLORIDE: 105 mmol/L (ref 101–111)
CO2: 25 mmol/L (ref 22–32)
CREATININE: 0.65 mg/dL (ref 0.44–1.00)
GFR calc Af Amer: >60 mL/min (ref >60)
GFR calc non Af Amer: >60 mL/min (ref >60)
GLUCOSE: 148 mg/dL — AB (ref 65–99)
Potassium: 4.3 mmol/L (ref 3.5–5.1)
Sodium: 135 mmol/L (ref 135–145)

## 2015-06-15 LAB — RETICULOCYTES
RBC.: 4 MIL/uL (ref 3.87–5.11)
RETIC COUNT ABSOLUTE: 68 10*3/uL (ref 19.0–186.0)
RETIC CT PCT: 1.7 % (ref 0.4–3.1)

## 2015-06-15 LAB — CBC
HEMATOCRIT: 33 % — AB (ref 36.0–46.0)
Hemoglobin: 10.6 g/dL — ABNORMAL LOW (ref 12.0–15.0)
MCH: 28.9 pg (ref 26.0–34.0)
MCHC: 32.1 g/dL (ref 30.0–36.0)
MCV: 89.9 fL (ref 78.0–100.0)
Platelets: 171 10*3/uL (ref 150–400)
RBC: 3.67 MIL/uL — AB (ref 3.87–5.11)
RDW: 13.2 % (ref 11.5–15.5)
WBC: 7.2 10*3/uL (ref 4.0–10.5)

## 2015-06-15 LAB — IRON AND TIBC
IRON: 44 ug/dL (ref 28–170)
Saturation Ratios: 16 % (ref 10.4–31.8)
TIBC: 283 ug/dL (ref 250–450)
UIBC: 239 ug/dL

## 2015-06-15 LAB — GLUCOSE, CAPILLARY
GLUCOSE-CAPILLARY: 147 mg/dL — AB (ref 65–99)
GLUCOSE-CAPILLARY: 140 mg/dL — AB (ref 65–99)
Glucose-Capillary: 116 mg/dL — ABNORMAL HIGH (ref 65–99)

## 2015-06-15 LAB — FERRITIN: FERRITIN: 114 ng/mL (ref 11–307)

## 2015-06-15 LAB — VITAMIN B12: VITAMIN B 12: 245 pg/mL (ref 180–914)

## 2015-06-15 LAB — FOLATE: Folate: 11.7 ng/mL (ref >5.9)

## 2015-06-15 MED ORDER — INSULIN ASPART 100 UNIT/ML ~~LOC~~ SOLN
0.0000 [IU] | Freq: Three times a day (TID) | SUBCUTANEOUS | Status: DC
  Administered 2015-06-15: 1 [IU] via SUBCUTANEOUS

## 2015-06-15 NOTE — Care Management Note (Signed)
Case Management Note  Patient Details  Name: [name] MRN: [id number] Date of Birth: May 23, 1945  Subjective/Objective:  70 y.o. F admitted 06/14/2015 for IT hip Fx. Pt is to be discharged to [facility]. Will defer disposition to CSW.                 Action/Plan:CM will sign off for now but will be available should additional discharge needs arise or disposition change.    Expected Discharge Date:                  Expected Discharge Plan:  Skilled Nursing Facility  In-House Referral:  Clinical Social Work  Discharge planning Services  CM Consult  Post Acute Care Choice:    Choice offered to:     DME Arranged:    DME Agency:     HH Arranged:    [place] Agency:     Status of Service:  Completed, signed off  Medicare Important Message Given:    Date Medicare IM Given:    Medicare IM give by:    Date Additional Medicare IM Given:    Additional Medicare Important Message give by:     If discussed at [place] of Stay Meetings, dates discussed:    Additional Comments:  Delrae Sawyers, RN 06/15/2015, 3:52 PM

## 2015-06-15 NOTE — Progress Notes (Signed)
Patient ID: [name], female   DOB: Jul 22, 1945, 70 y.o.   MRN: [id number] Postoperative day 1 and internal fixation left intertrochanteric hip fracture. Patient is more comfortable this morning than she was preoperatively. Plan for physical therapy touchdown weightbearing on the left anticipate patient will need short-term skilled nursing care.

## 2015-06-15 NOTE — Progress Notes (Signed)
Occupational Therapy Evaluation Patient Details Name: [name] MRN: [id number] DOB: Jul 09, 1945 Today's Date: 06/15/2015    History of Present Illness [name] is a 70 y.o. female who presents with left intertrochanteric hip fracture. Patient is now s/p L IM nailing and TDWB status.   Clinical Impression   Patient presenting with decreased ADL and functional mobility independence secondary to above. Patient independent PTA. Patient currently functioning at an overall min to max assist level. Patient will benefit from acute OT to increase overall independence in the areas of ADLs, functional mobility, and overall safety in order to safely discharge to venue listed below.     Follow Up Recommendations  Supervision/Assistance - 24 hour;SNF    Equipment Recommendations  Other (comment) (TBD next venue of care)    Recommendations for Other Services   None at this time   Precautions / Restrictions Precautions Precautions: Fall Restrictions Weight Bearing Restrictions: Yes LLE Weight Bearing: Touchdown weight bearing Other Position/Activity Restrictions: Went over [employer] restricitions with patient     Mobility Bed Mobility Overal bed mobility: Needs Assistance;+2 for physical assistance Bed Mobility: Supine to Sit     Supine to sit: Mod assist;+2 for physical assistance     General bed mobility comments: Pt moving slow and required step-by-step instructions for transition to EOB. Pt required assist to advance LE's towards EOB as well as hold LLE up due to pain as she was scooting. Therapist also in back providing trunk support during elevation to full sitting position.   Transfers Overall transfer level: Needs assistance Equipment used: Rolling walker (2 wheeled) Transfers: Sit to/from Stand Sit to Stand: Min assist         General transfer comment: +2 assist required initially for power-up to full standing, and pt progressed to +1 min assist from elevated BSC.      Balance Overall balance assessment: Needs assistance Sitting-balance support: Feet supported;No upper extremity supported Sitting balance-Leahy Scale: Poor Sitting balance - Comments: Trying to keep weight off L hip - leaning to the R side Postural control: Right lateral lean   Standing balance-Leahy Scale: Poor Standing balance comment: requires UE support on walker at this time    ADL Overall ADL's : Needs assistance/impaired Eating/Feeding: Set up;Sitting   Grooming: Set up;Sitting   Upper Body Bathing: Set up;Sitting   Lower Body Bathing: Moderate assistance;Sit to/from stand   Upper Body Dressing : Set up;Sitting   Lower Body Dressing: Maximal assistance;Sit to/from stand   Toilet Transfer: Moderate assistance;BSC;RW;Stand-pivot;+2 for safety/equipment   Toileting- Clothing Manipulation and Hygiene: Moderate assistance;Sit to/from stand       Functional mobility during ADLs: Moderate assistance      Pertinent Vitals/Pain Pain Assessment: Faces Faces Pain Scale: Hurts whole lot Pain Location: L hip Pain Descriptors / Indicators: Cramping;Operative site guarding Pain Intervention(s): Limited activity within patient's tolerance;Monitored during session;Repositioned     Hand Dominance Right   Extremity/Trunk Assessment Upper Extremity Assessment Upper Extremity Assessment: Generalized weakness   Lower Extremity Assessment Lower Extremity Assessment: Defer to PT evaluation LLE Deficits / Details: Decreased strength and AROM consistent with above mentioned procedure LLE: Unable to fully assess due to pain   Cervical / Trunk Assessment Cervical / Trunk Assessment: Normal   Communication Communication Communication: No difficulties   Cognition Arousal/Alertness: Awake/alert Behavior During Therapy: WFL for tasks assessed/performed Overall Cognitive Status: Within Functional Limits for tasks assessed              Home Living Family/patient expects to  be  discharged to:: Private residence Living Arrangements: Spouse/significant other Available Help at Discharge: Family;Available 24 hours/day Type of Home: House Home Access: Stairs to enter [employer] of Steps: 3 Entrance Stairs-Rails:  (Husband reports "there will be") Home Layout: Two level;Able to live on main level with bedroom/bathroom Alternate Level Stairs-Number of Steps: 3 steps to get into Den, but pt reports she doesn't need to go down there   Bathroom Shower/Tub: Tub/shower unit;Door   [employer] Toilet: Handicapped height Bathroom Accessibility: Yes   Home Equipment: None     Prior Functioning/Environment Level of Independence: Independent     OT Diagnosis: Generalized weakness;Acute pain   OT Problem List: Decreased strength;Decreased activity tolerance;Impaired balance (sitting and/or standing);Decreased safety awareness;Decreased knowledge of use of DME or AE;Decreased knowledge of precautions;Pain   OT Treatment/Interventions: Self-care/ADL training;Therapeutic exercise;Energy conservation;DME and/or AE instruction;Therapeutic activities;Patient/family education;Balance training    OT Goals(Current goals can be found in the care plan section) Acute Rehab OT Goals Patient Stated Goal: Home eventually OT Goal Formulation: With patient/family Time For Goal Achievement: 06/29/15 Potential to Achieve Goals: Good ADL Goals Pt Will Perform Grooming: with min assist;standing Pt Will Perform Lower Body Bathing: with min assist;sit to/from stand Pt Will Perform Lower Body Dressing: with min assist;sit to/from stand Pt Will Transfer to Toilet: with min assist;ambulating;bedside commode Additional ADL Goal #1: Pt will engage in bed mobility with supervision as a precursor for ADL and to decrease burden of care  OT Frequency: Min 2X/week   Barriers to D/C: none known at this time       Co-evaluation PT/OT/SLP Co-Evaluation/Treatment: Yes Reason for  Co-Treatment: For patient/therapist safety PT goals addressed during session: Mobility/safety with mobility;Balance;Proper use of DME OT goals addressed during session: ADL's and self-care;Other (comment) (functional mobility)      End of Session Equipment Utilized During Treatment: Gait belt;Rolling walker  Activity Tolerance: Patient tolerated treatment well Patient left: in chair;with call bell/phone within reach;with chair alarm set;with family/visitor present   Time: [id number] OT Time Calculation (min): 30 min Charges:  OT General Charges $OT Visit: 1 Procedure OT Evaluation $OT Eval Moderate Complexity: 1 Procedure G-Codes: OT G-codes **NOT FOR INPATIENT CLASS** Functional Assessment Tool Used: clinical judgement  Functional Limitation: Self care Self Care Current Status [id number]): At least 60 percent but less than 80 percent impaired, limited or restricted Self Care Goal Status [id number]): At least 1 percent but less than 20 percent impaired, limited or restricted  Chrys Racer 06/15/2015, 1:13 PM

## 2015-06-15 NOTE — Evaluation (Signed)
Physical Therapy Evaluation Patient Details Name: [name] MRN: [id number] DOB: 05/25/1945 Today's Date: 06/15/2015   History of Present Illness  [name] is a 70 y.o. female who presents with left intertrochanteric hip fracture. Patient is now s/p L IM nailing and TDWB status.  Clinical Impression  Pt admitted with above diagnosis. Pt currently with functional limitations due to the deficits listed below (see PT Problem List). At the time of PT eval pt was able to perform transfers and very minimal ambulation with up to +2 assist. Pt unable to clear floor with RLE during attempts at ambulation at this time and is sliding R foot along to advance. Pt is agreeable to SNF at d/c to maximize functional independence prior to return home with family. Pt will benefit from skilled PT to increase their independence and safety with mobility to allow discharge to the venue listed below.       Follow Up Recommendations SNF;Supervision/Assistance - 24 hour    Equipment Recommendations  Rolling walker with 5" wheels;3in1 (PT)    Recommendations for Other Services       Precautions / Restrictions Precautions Precautions: Fall Restrictions Weight Bearing Restrictions: Yes LLE Weight Bearing: Touchdown weight bearing Other Position/Activity Restrictions: Went over [employer] restricitions with patient      Mobility  Bed Mobility Overal bed mobility: Needs Assistance;+2 for physical assistance Bed Mobility: Supine to Sit     Supine to sit: Mod assist;+2 for physical assistance     General bed mobility comments: Pt moving slow and required step-by-step instructions for transition to EOB. Pt required assist to advance LE's towards EOB as well as hold LLE up due to pain as she was scooting. Therapist also in back providing trunk support during elevation to full sitting position.   Transfers Overall transfer level: Needs assistance Equipment used: Rolling walker (2 wheeled) Transfers: Sit  to/from Stand Sit to Stand: Min assist         General transfer comment: +2 assist required initially for power-up to full standing, and pt progressed to +1 min assist from elevated BSC.   Ambulation/Gait Ambulation/Gait assistance: Min assist;+2 safety/equipment Ambulation Distance (Feet): 5 Feet Assistive device: Rolling walker (2 wheeled) Gait Pattern/deviations: Step-to pattern;Decreased stride length;Trunk flexed Gait velocity: Decreased Gait velocity interpretation: Below normal speed for age/gender General Gait Details: Pt was able to shuffle RLE around from [facility] to recliner chair (~5 feet). Pt mostly kept her LLE elevated to prevent putting too much weight through it.   Stairs            Wheelchair Mobility    Modified Rankin (Stroke Patients Only)       Balance Overall balance assessment: Needs assistance Sitting-balance support: Feet supported;No upper extremity supported Sitting balance-Leahy Scale: Poor Sitting balance - Comments: Trying to keep weight off L hip - leaning to the R side Postural control: Right lateral lean   Standing balance-Leahy Scale: Poor Standing balance comment: Requires UE support on walker at this time.                              Pertinent Vitals/Pain Pain Assessment: Faces Faces Pain Scale: Hurts whole lot Pain Location: L hip Pain Descriptors / Indicators: Cramping;Operative site guarding Pain Intervention(s): Limited activity within patient's tolerance;Monitored during session;Repositioned    Home Living Family/patient expects to be discharged to:: Private residence Living Arrangements: Spouse/significant other Available Help at Discharge: Family;Available 24 hours/day Type of Home:  House Home Access: Stairs to enter Entrance Stairs-Rails:  (Husband reports "there will be") Entrance Stairs-Number of Steps: 3 Home Layout: Two level;Able to live on main level with bedroom/bathroom Home Equipment: None       Prior Function Level of Independence: Independent               Hand Dominance   Dominant Hand: Right    Extremity/Trunk Assessment   Upper Extremity Assessment: Defer to OT evaluation           Lower Extremity Assessment: LLE deficits/detail   LLE Deficits / Details: Decreased strength and AROM consistent with above mentioned procedure  Cervical / Trunk Assessment: Normal  Communication   Communication: No difficulties  Cognition Arousal/Alertness: Awake/alert Behavior During Therapy: WFL for tasks assessed/performed Overall Cognitive Status: Within Functional Limits for tasks assessed                      General Comments      Exercises        Assessment/Plan    PT Assessment Patient needs continued PT services  PT Diagnosis Difficulty walking;Acute pain   PT Problem List Decreased strength;Decreased range of motion;Decreased activity tolerance;Decreased balance;Decreased mobility;Decreased knowledge of use of DME;Decreased safety awareness;Decreased knowledge of precautions;Pain  PT Treatment Interventions DME instruction;Gait training;Stair training;Functional mobility training;Therapeutic activities;Therapeutic exercise;Neuromuscular re-education;Patient/family education   PT Goals (Current goals can be found in the Care Plan section) Acute Rehab PT Goals Patient Stated Goal: Home eventually PT Goal Formulation: With patient/family Time For Goal Achievement: 06/29/15 Potential to Achieve Goals: Good    Frequency Min 3X/week   Barriers to discharge        Co-evaluation PT/OT/SLP Co-Evaluation/Treatment: Yes Reason for Co-Treatment: For patient/therapist safety PT goals addressed during session: Mobility/safety with mobility;Balance;Proper use of DME         End of Session Equipment Utilized During Treatment: Gait belt Activity Tolerance: Patient limited by pain Patient left: in chair;with call bell/phone within reach;with chair  alarm set;with family/visitor present Nurse Communication: Mobility status    Functional Assessment Tool Used: Clinical judgement Functional Limitation: Mobility: Walking and moving around Mobility: Walking and Moving Around Current Status [phone]): At least 60 percent but less than 80 percent impaired, limited or restricted Mobility: Walking and Moving Around Goal Status [phone]): At least 40 percent but less than 60 percent impaired, limited or restricted    Time: [id number] PT Time Calculation (min) (ACUTE ONLY): 31 min   Charges:   PT Evaluation $PT Eval Moderate Complexity: 1 Procedure     PT G Codes:   PT G-Codes **NOT FOR INPATIENT CLASS** Functional Assessment Tool Used: Clinical judgement Functional Limitation: Mobility: Walking and moving around Mobility: Walking and Moving Around Current Status [id number]): At least 60 percent but less than 80 percent impaired, limited or restricted Mobility: Walking and Moving Around Goal Status [phone]): At least 40 percent but less than 60 percent impaired, limited or restricted    Rolinda Roan 06/15/2015, 12:53 PM   Rolinda Roan, PT, DPT Acute Rehabilitation Services Pager: [phone]

## 2015-06-15 NOTE — Progress Notes (Signed)
PROGRESS NOTE    [name]  [id number] DOB: 1945-07-03 DOA: 06/14/2015 PCP: [name], MD   Brief Narrative:  70 year old with history of nephrolithiasis, ongoing urinary tract infection presented with right hip fracture. Patient underwent surgery on 06/14/2015. Pending PT OT.  Assessment & Plan   Intertrochanteric fracture of the left hip -Status post mechanical fall -X-rays at [facility] revealed acute comminuted intertrochanteric fracture of the left hip -Orthopedics, consulted and appreciated, status post intramedullary nail -Continue pain control -PT and OT consulted, pending  Urinary tract infection -UA on admission was unremarkable -Patient was taking nitrofurantoin at home  Hyperglycemia -No history of diabetes -Likely secondary to stress, continue to monitor glucose -Will obtain hemoglobin A1c -Place on insulin sliding scale, CBG monitoring  Normocytic anemia -Hemoglobin currently 10.6 -Will monitor CBC and obtain anemia panel  DVT Prophylaxis  SCDs  Code Status: Full  Family Communication: None at bedside.  Disposition Plan: Admitted, pending PT and OT evaluations. May need SNF  Consultants Orthopedic surgery, Dr. Sharol Given  Procedures  Intertrochanteric intramedullary nail  Antibiotics   Anti-infectives    Start     Dose/Rate Route Frequency Ordered Stop   06/14/15 2230  clindamycin (CLEOCIN) IVPB 600 mg     600 mg 100 mL/hr over 30 Minutes Intravenous Every 6 hours 06/14/15 1947 06/15/15 0621   06/14/15 1545  clindamycin (CLEOCIN) IVPB 900 mg     900 mg 100 mL/hr over 30 Minutes Intravenous To Short Stay 06/14/15 0750 06/14/15 1659      Subjective:   [name] seen and examined today.  Patient states she has mild pain in her hip. Currently denies any chest pain, shortness breath, abdominal pain, nausea or vomiting, diarrhea or constipation.  Objective:   Filed Vitals:   06/14/15 1851 06/14/15 1906 06/14/15 1942 06/15/15 0608  BP:  125/68 125/66 133/66 100/60  Pulse: 81 83 80 72  Temp:   97.7 F (36.5 C) 97.4 F (36.3 C)  TempSrc:   Oral Oral  Resp: 13 10 15 15   Weight:      SpO2: 97% 92% 98% 97%    Intake/Output Summary (Last 24 hours) at 06/15/15 1127 Last data filed at 06/15/15 0700  Gross per 24 hour  Intake   1925 ml  Output   1050 ml  Net    875 ml   Filed Weights   06/14/15 0628  Weight: 81.8 kg (180 lb 5.4 oz)    Exam  General: Well developed, well nourished, NAD, appears stated age  28: NCAT,mucous membranes moist.   Cardiovascular: S1 S2 auscultated, no murmurs, RRR  Respiratory: Clear to auscultation bilaterally   Abdomen: Soft, nontender, nondistended, + bowel sounds  Extremities: warm dry without cyanosis clubbing or edema  Neuro: AAOx3, nonfocal  Psych: Normal affect and demeanor   Data Reviewed: I have personally reviewed following labs and imaging studies  CBC:  Recent Labs Lab 06/15/15 0453  WBC 7.2  HGB 10.6*  HCT 33.0*  MCV 89.9  PLT [id number]   Basic Metabolic Panel:  Recent Labs Lab 06/15/15 0453  NA 135  K 4.3  CL 105  CO2 25  GLUCOSE 148*  BUN 8  CREATININE 0.65  CALCIUM 8.3*   GFR: CrCl cannot be calculated (Unknown ideal weight.). Liver Function Tests: No results for input(s): AST, ALT, ALKPHOS, BILITOT, PROT, ALBUMIN in the last 168 hours. No results for input(s): LIPASE, AMYLASE in the last 168 hours. No results for input(s): AMMONIA in the last 168  hours. Coagulation Profile:  Recent Labs Lab 06/14/15 0937  INR 1.19   Cardiac Enzymes: No results for input(s): CKTOTAL, CKMB, CKMBINDEX, TROPONINI in the last 168 hours. BNP (last 3 results) No results for input(s): PROBNP in the last 8760 hours. HbA1C: No results for input(s): HGBA1C in the last 72 hours. CBG: No results for input(s): GLUCAP in the last 168 hours. Lipid Profile: No results for input(s): CHOL, HDL, LDLCALC, TRIG, CHOLHDL, LDLDIRECT in the last 72 hours. Thyroid  Function Tests: No results for input(s): TSH, T4TOTAL, FREET4, T3FREE, THYROIDAB in the last 72 hours. Anemia Panel: No results for input(s): VITAMINB12, FOLATE, FERRITIN, TIBC, IRON, RETICCTPCT in the last 72 hours. Urine analysis: No results found for: COLORURINE, APPEARANCEUR, LABSPEC, PHURINE, GLUCOSEU, HGBUR, BILIRUBINUR, KETONESUR, PROTEINUR, UROBILINOGEN, NITRITE, LEUKOCYTESUR Sepsis Labs: @LABRCNTIP (procalcitonin:4,lacticidven:4)  ) Recent Results (from the past 240 hour(s))  Surgical pcr screen     Status: None   Collection Time: 06/14/15 11:16 AM  Result Value Ref Range Status   MRSA, PCR NEGATIVE NEGATIVE Final   Staphylococcus aureus NEGATIVE NEGATIVE Final    Comment:        The Xpert SA Assay (FDA approved for NASAL specimens in patients over 62 years of age), is one component of a comprehensive surveillance program.  Test performance has been validated by [facility] for patients greater than or equal to 50 year old. It is not intended to diagnose infection nor to guide or monitor treatment.       Radiology Studies: Dg Hip Operative Unilat With Pelvis Left  06/14/2015  CLINICAL DATA:  Intra medullary nail placement across left hip fracture. FLUOROSCOPY TIME:  21 seconds. Images: 2. EXAM: OPERATIVE LEFT HIP (WITH PELVIS IF PERFORMED) 2 VIEWS TECHNIQUE: Fluoroscopic spot image(s) were submitted for interpretation post-operatively. COMPARISON:  X-ray from earlier today FINDINGS: An intra medullary nail has been placed across the left intertrochanteric fracture. Hardware is in good position. Alignment is near anatomic on provided images. IMPRESSION: Appropriate intra medullary nail placement. Electronically Signed   By: Dorise Bullion III M.D   On: 06/14/2015 17:38     Scheduled Meds: . aspirin EC  325 mg Oral Q breakfast   Continuous Infusions: . sodium chloride 75 mL/hr at 06/14/15 2152  . sodium chloride    . lactated ringers 50 mL/hr (06/14/15 1513)      LOS: 1 day   Time Spent in minutes   30 minutes  Wylee Ogden D.O. on 06/15/2015 at 11:27 AM  Between 7am to 7pm - Pager - [phone]  After 7pm go to [email or web address] - password [email or web address]  And look for the night coverage person covering for me after hours  [facility] Office  [phone]

## 2015-06-16 ENCOUNTER — Encounter (HOSPITAL_COMMUNITY): Payer: Self-pay | Admitting: Orthopedic Surgery

## 2015-06-16 DIAGNOSIS — S72002D Fracture of unspecified part of neck of left femur, subsequent encounter for closed fracture with routine healing: Secondary | ICD-10-CM

## 2015-06-16 DIAGNOSIS — K5900 Constipation, unspecified: Secondary | ICD-10-CM | POA: Diagnosis not present

## 2015-06-16 DIAGNOSIS — D649 Anemia, unspecified: Secondary | ICD-10-CM

## 2015-06-16 DIAGNOSIS — R739 Hyperglycemia, unspecified: Secondary | ICD-10-CM

## 2015-06-16 LAB — GLUCOSE, CAPILLARY
GLUCOSE-CAPILLARY: 89 mg/dL (ref 65–99)
GLUCOSE-CAPILLARY: 97 mg/dL (ref 65–99)
Glucose-Capillary: 99 mg/dL (ref 65–99)
Glucose-Capillary: 110 mg/dL — ABNORMAL HIGH (ref 65–99)

## 2015-06-16 LAB — BASIC METABOLIC PANEL
ANION GAP: 9 (ref 5–15)
BUN: 6 mg/dL (ref 6–20)
CALCIUM: 8.2 mg/dL — AB (ref 8.9–10.3)
CHLORIDE: 106 mmol/L (ref 101–111)
CO2: 26 mmol/L (ref 22–32)
CREATININE: 0.67 mg/dL (ref 0.44–1.00)
GFR calc non Af Amer: >60 mL/min (ref >60)
Glucose, Bld: 102 mg/dL — ABNORMAL HIGH (ref 65–99)
Potassium: 3.6 mmol/L (ref 3.5–5.1)
SODIUM: 141 mmol/L (ref 135–145)

## 2015-06-16 LAB — CBC
HEMATOCRIT: 30.2 % — AB (ref 36.0–46.0)
HEMOGLOBIN: 9.7 g/dL — AB (ref 12.0–15.0)
MCH: 28.9 pg (ref 26.0–34.0)
MCHC: 32.1 g/dL (ref 30.0–36.0)
MCV: 89.9 fL (ref 78.0–100.0)
Platelets: 167 10*3/uL (ref 150–400)
RBC: 3.36 MIL/uL — ABNORMAL LOW (ref 3.87–5.11)
RDW: 13.6 % (ref 11.5–15.5)
WBC: 8.4 10*3/uL (ref 4.0–10.5)

## 2015-06-16 LAB — HEMOGLOBIN A1C
HEMOGLOBIN A1C: 5.3 % (ref 4.8–5.6)
MEAN PLASMA GLUCOSE: 105 mg/dL

## 2015-06-16 NOTE — NC FL2 (Signed)
[place] MEDICAID FL2 LEVEL OF CARE SCREENING TOOL     IDENTIFICATION  Patient Name: [name] Birthdate: Jul 21, 1945 Sex: female Admission Date (Current Location): 06/14/2015  [facility] and [place] Number:  Herbalist and Address:  [facility], [place] [address] [place], [place] [address]      Provider Number: [id number]  Attending Physician Name and Address:  Murlean Iba, MD  Relative Name and Phone Number:       Current Level of Care: Hospital Recommended Level of Care: [place] Prior Approval Number:    Date Approved/Denied:   PASRR Number: [id number]  Discharge Plan: SNF    Current Diagnoses: Patient Active Problem List   Diagnosis Date Noted  . Constipation 06/16/2015  . Normocytic anemia   . Intertrochanteric fracture of left hip ([place]) 06/14/2015  . Closed left hip fracture ([place]) 06/14/2015  . Hyperglycemia 06/14/2015    Orientation RESPIRATION BLADDER Height & Weight     Self, Time, Situation, Place  Normal Continent Weight: 180 lb 5.4 oz (81.8 kg) Height:     BEHAVIORAL SYMPTOMS/MOOD NEUROLOGICAL BOWEL NUTRITION STATUS      Continent Diet (Please see discharge summary.)  AMBULATORY STATUS COMMUNICATION OF NEEDS Skin   Limited Assist Verbally Surgical wounds                       Personal Care Assistance Level of Assistance  Bathing, Feeding, Dressing Bathing Assistance: Limited assistance Feeding assistance: Independent Dressing Assistance: Limited assistance     Functional Limitations Info             SPECIAL CARE FACTORS FREQUENCY  PT (By licensed PT), OT (By licensed OT)     PT Frequency: 5 OT Frequency: 5            Contractures      Additional Factors Info  Code Status, Allergies Code Status Info: FULL Allergies Info: Sulfa Antibiotics, Penicillins, Other           Current Medications (06/16/2015):  This is the current hospital active medication  list Current Facility-Administered Medications  Medication Dose Route Frequency Provider Last Rate Last Dose  . 0.9 %  sodium chloride infusion   Intravenous Continuous Meridee Score V, MD      . acetaminophen (TYLENOL) tablet 650 mg  650 mg Oral Q6H PRN Newt Minion, MD       Or  . acetaminophen (TYLENOL) suppository 650 mg  650 mg Rectal Q6H PRN Newt Minion, MD      . aspirin EC tablet 325 mg  325 mg Oral Q breakfast Newt Minion, MD   325 mg at 06/16/15 0844  . bisacodyl (DULCOLAX) suppository 10 mg  10 mg Rectal Daily PRN Radene Gunning, NP      . HYDROcodone-acetaminophen (NORCO/VICODIN) 5-325 MG per tablet 1-2 tablet  1-2 tablet Oral Q6H PRN Newt Minion, MD   2 tablet at 06/16/15 0539  . insulin aspart (novoLOG) injection 0-9 Units  0-9 Units Subcutaneous TID WC Maryann Mikhail, DO   1 Units at 06/15/15 1330  . lactated ringers infusion   Intravenous Continuous Lillia Abed, MD 50 mL/hr at 06/14/15 1513    . menthol-cetylpyridinium (CEPACOL) lozenge 3 mg  1 lozenge Oral PRN Newt Minion, MD       Or  . phenol (CHLORASEPTIC) mouth spray 1 spray  1 spray Mouth/Throat PRN Newt Minion, MD      .  metoCLOPramide (REGLAN) tablet 5-10 mg  5-10 mg Oral Q8H PRN Newt Minion, MD       Or  . metoCLOPramide (REGLAN) injection 5-10 mg  5-10 mg Intravenous Q8H PRN Mer[place]e Score V, MD      . morphine 2 MG/ML injection 0.5 mg  0.5 mg Intravenous Q2H PRN Newt Minion, MD   0.5 mg at 06/15/15 [id number]  . ondansetron (ZOFRAN) tablet 4 mg  4 mg Oral Q6H PRN Newt Minion, MD       Or  . ondansetron [facility]) injection 4 mg  4 mg Intravenous Q6H PRN Newt Minion, MD      . senna-docusate (Senokot-S) tablet 1 tablet  1 tablet Oral QHS PRN Radene Gunning, NP   1 tablet at 06/14/15 2237     Discharge Medications: Please see discharge summary for a list of discharge medications.  Relevant Imaging Results:  Relevant Lab Results:   Additional Information [id number]  Caroline Sauger,  [place]

## 2015-06-16 NOTE — Clinical Social Work Placement (Signed)
   CLINICAL SOCIAL WORK PLACEMENT  NOTE  Date:  06/16/2015  Patient Details  Name: [name] MRN: [id number] Date of Birth: 20-Apr-1945  Clinical Social Work is seeking post-discharge placement for this patient at the [place] level of care (*CSW will initial, date and re-position this form in  chart as items are completed):  Yes   Patient/family provided with [place] Work Department's list of facilities offering this level of care within the geographic area requested by the patient (or if unable, by the patient's family).  Yes   Patient/family informed of their freedom to choose among providers that offer the needed level of care, that participate in Medicare, Medicaid or managed care program needed by the patient, have an available bed and are willing to accept the patient.  Yes   Patient/family informed of [place]'s ownership interest in [facility] and [facility], as well as of the fact that they are under no obligation to receive care at these facilities.  PASRR submitted to EDS on 06/16/15     PASRR number received on 06/16/15     Existing PASRR number confirmed on       FL2 transmitted to all facilities in geographic area requested by pt/family on 06/16/15     FL2 transmitted to all facilities within larger geographic area on       Patient informed that his/her managed care company has contracts with or will negotiate with certain facilities, including the following:            Patient/family informed of bed offers received.  Patient chooses bed at       Physician recommends and patient chooses bed at      Patient to be transferred to   on  .  Patient to be transferred to facility by       Patient family notified on   of transfer.  Name of family member notified:        PHYSICIAN Please sign FL2     Additional Comment:    _______________________________________________ Caroline Sauger, LCSW 06/16/2015, 12:35  PM

## 2015-06-16 NOTE — Progress Notes (Addendum)
PROGRESS NOTE    [name]  [id number]  DOB: [birth year]  DOA: 06/14/2015 PCP: [name], MD Outpatient Specialists:   Hospital course: 70 year old with history of nephrolithiasis, ongoing urinary tract infection presented with right hip fracture. Patient underwent surgery on 06/14/2015. Pending PT OT.   Assessment & Plan:   Intertrochanteric fracture of the left hip -Status post mechanical fall -X-rays at [facility] revealed acute comminuted intertrochanteric fracture of the left hip -Orthopedics, consulted and appreciated, status post intramedullary nail -Continue pain control / DC IVF -PT and OT consulted, recommended SNF rehab -consulting social worker today for rehab placement  Urinary tract infection -UA on admission was unremarkable -Patient was taking nitrofurantoin at home  Hyperglycemia -No history of diabetes -Likely secondary to stress, continue to monitor glucose -Will obtain hemoglobin A1c -Place on insulin sliding scale, CBG monitoring  Normocytic anemia -Hemoglobin currently 9.7. Holding stable.  -Will monitor CBC and obtain anemia panel  Constipation  -opioid induced -plan oral laxatives and follow (see med rec) -continue PT/OT for ambulation  DVT Prophylaxis SCDs  Code Status: Full  Family Communication: None at bedside.  Disposition Plan:  SNF Placement - will consult social worker today 06/16/15   Consultants:  Orthopedics Duda  Procedures:  ORIF Left Hip 06/14/15     Subjective: Pt reports some constipation but overall pain is getting better and wants to pursue skilled rehab  Objective: Filed Vitals:   06/15/15 0608 06/15/15 1300 06/15/15 2039 06/16/15 0500  BP: 100/60 125/58 112/55 112/56  Pulse: 72 81 86 75  Temp: 97.4 F (36.3 C) 98.2 F (36.8 C) 98 F (36.7 C) 97.6 F (36.4 C)  TempSrc: Oral Oral Oral Oral  Resp: 15 16 16 16   Weight:      SpO2: 97% 97% 97% 100%    Intake/Output Summary (Last 24 hours) at  06/16/15 0742 Last data filed at 06/15/15 1700  Gross per 24 hour  Intake    720 ml  Output      0 ml  Net    720 ml   Filed Weights   06/14/15 0628  Weight: 180 lb 5.4 oz (81.8 kg)    Exam:  General exam: awake, alert, cooperative, NAD Respiratory system: Clear. No increased work of breathing. Cardiovascular system: S1 & S2 heard, RRR. No JVD, murmurs, gallops, clicks or pedal edema. Gastrointestinal system: Abdomen is mildly distended, soft and nontender. Normal bowel sounds heard. Central nervous system: Alert and oriented. No focal neurological deficits. Extremities: no pretibial edema, Symmetric 5 x 5 power.   Data Reviewed: Basic Metabolic Panel:  Recent Labs Lab 06/15/15 0453 06/16/15 0459  NA 135 141  K 4.3 3.6  CL 105 106  CO2 25 26  GLUCOSE 148* 102*  BUN 8 6  CREATININE 0.65 0.67  CALCIUM 8.3* 8.2*   Liver Function Tests: No results for input(s): AST, ALT, ALKPHOS, BILITOT, PROT, ALBUMIN in the last 168 hours. No results for input(s): LIPASE, AMYLASE in the last 168 hours. No results for input(s): AMMONIA in the last 168 hours. CBC:  Recent Labs Lab 06/15/15 0453 06/16/15 0459  WBC 7.2 8.4  HGB 10.6* 9.7*  HCT 33.0* 30.2*  MCV 89.9 89.9  PLT 171 167   Cardiac Enzymes: No results for input(s): CKTOTAL, CKMB, CKMBINDEX, TROPONINI in the last 168 hours. BNP (last 3 results) No results for input(s): PROBNP in the last 8760 hours. CBG:  Recent Labs Lab 06/15/15 1323 06/15/15 1609 06/15/15 2248 06/16/15 0642  GLUCAP 147*  116* 140* 89    Recent Results (from the past 240 hour(s))  Surgical pcr screen     Status: None   Collection Time: 06/14/15 11:16 AM  Result Value Ref Range Status   MRSA, PCR NEGATIVE NEGATIVE Final   Staphylococcus aureus NEGATIVE NEGATIVE Final    Comment:        The Xpert SA Assay (FDA approved for NASAL specimens in patients over 51 years of age), is one component of a comprehensive surveillance program.   Test performance has been validated by [facility] for patients greater than or equal to 7 year old. It is not intended to diagnose infection nor to guide or monitor treatment.      Studies: Dg Hip Operative Unilat With Pelvis Left  06/14/2015  CLINICAL DATA:  Intra medullary nail placement across left hip fracture. FLUOROSCOPY TIME:  21 seconds. Images: 2. EXAM: OPERATIVE LEFT HIP (WITH PELVIS IF PERFORMED) 2 VIEWS TECHNIQUE: Fluoroscopic spot image(s) were submitted for interpretation post-operatively. COMPARISON:  X-ray from earlier today FINDINGS: An intra medullary nail has been placed across the left intertrochanteric fracture. Hardware is in good position. Alignment is near anatomic on provided images. IMPRESSION: Appropriate intra medullary nail placement. Electronically Signed   By: Dorise Bullion III M.D   On: 06/14/2015 17:38   Scheduled Meds: . aspirin EC  325 mg Oral Q breakfast  . insulin aspart  0-9 Units Subcutaneous TID WC   Continuous Infusions: . sodium chloride    . lactated ringers 50 mL/hr (06/14/15 1513)    Active Problems:   Intertrochanteric fracture of left hip (HCC)   Closed left hip fracture (HCC)   Hyperglycemia   Normocytic anemia   Irwin Brakeman, MD, FAAFP [facility] Pager [phone]  If 7PM-7AM, please contact night-coverage [email or web address] Password [email or web address] 06/16/2015, 7:42 AM    LOS: 2 days

## 2015-06-16 NOTE — Care Management Important Message (Signed)
Important Message  Patient Details  Name: [name] MRN: [id number] Date of Birth: [birth year]   Medicare Important Message Given:  Yes    Nathen May 06/16/2015, 10:16 AM

## 2015-06-16 NOTE — Clinical Social Work Note (Signed)
Clinical Social Work Assessment  Patient Details  Name: [name] MRN: [id number] Date of Birth: 09-Nov-1945  Date of referral:  06/16/15               Reason for consult:  Facility Placement                Permission sought to share information with:  Chartered certified accountant granted to share information::     Name::        Agency::  [facility]  Relationship::     Contact Information:     Housing/Transportation Living arrangements for the past 2 months:  [place] of Information:  Patient Patient Interpreter Needed:  None Criminal Activity/Legal Involvement Pertinent to Current Situation/Hospitalization:  No - Comment as needed Significant Relationships:  Adult Children, Spouse Lives with:  Spouse Do you feel safe going back to the place where you live?  No (Fall risk.) Need for family participation in patient care:  No (Coment) (Patient able to make own decisions.)  Care giving concerns:  Patient expressed no concerns at this time.   Social Worker assessment / plan:  LCSW received referral for possible SNF placement. LCSW met with patient at bedside to discuss discharge planning needs. Patient informed LCSW that patient would prefer to be discharged to SNF in [facility] (preference for [facility] or [place]). LCSW to continue to follow and assist with discharge planning needs.  Employment status:  Retired Forensic scientist:  Medicare PT Recommendations:  [place] / Referral to community resources:  [place]  Patient/Family's Response to care:  Patient understanding and agreeable to [employer] of care.  Patient/Family's Understanding of and Emotional Response to Diagnosis, Current Treatment, and Prognosis:  Patient understanding and agreeable to LCSW plan of care.  Emotional Assessment Appearance:  Appears stated age Attitude/Demeanor/Rapport:  Other  (Appropriate.) Affect (typically observed):  Accepting, Appropriate, Pleasant Orientation:  Oriented to Self, Oriented to Place, Oriented to  Time, Oriented to Situation Alcohol / Substance use:  Not Applicable Psych involvement (Current and /or in the community):  No (Comment) (Not appropriate on this admission.)  Discharge Needs  Concerns to be addressed:  No discharge needs identified Readmission within the last 30 days:  No Current discharge risk:  None Barriers to Discharge:  No Barriers Identified   Caroline Sauger, LCSW 06/16/2015, 12:26 PM [phone]

## 2015-06-17 DIAGNOSIS — N390 Urinary tract infection, site not specified: Secondary | ICD-10-CM

## 2015-06-17 LAB — GLUCOSE, CAPILLARY
GLUCOSE-CAPILLARY: 106 mg/dL — AB (ref 65–99)
Glucose-Capillary: 108 mg/dL — ABNORMAL HIGH (ref 65–99)
Glucose-Capillary: 107 mg/dL — ABNORMAL HIGH (ref 65–99)

## 2015-06-17 LAB — CBC
HEMATOCRIT: 28.5 % — AB (ref 36.0–46.0)
Hemoglobin: 9.2 g/dL — ABNORMAL LOW (ref 12.0–15.0)
MCH: 29.7 pg (ref 26.0–34.0)
MCHC: 32.3 g/dL (ref 30.0–36.0)
MCV: 91.9 fL (ref 78.0–100.0)
PLATELETS: 155 10*3/uL (ref 150–400)
RBC: 3.1 MIL/uL — ABNORMAL LOW (ref 3.87–5.11)
RDW: 13.8 % (ref 11.5–15.5)
WBC: 6.9 10*3/uL (ref 4.0–10.5)

## 2015-06-17 NOTE — Progress Notes (Signed)
PROGRESS NOTE    [name]  [id number]  DOB: [birth year]  DOA: 06/14/2015 PCP: [name], MD Outpatient Specialists:   Hospital course: 71 year old with history of nephrolithiasis, ongoing urinary tract infection presented with right hip fracture. Patient underwent surgery on 06/14/2015. Pending PT OT.  Assessment & Plan:   Intertrochanteric fracture of the left hip -Status post mechanical fall -X-rays at [facility] revealed acute comminuted intertrochanteric fracture of the left hip -Orthopedics, consulted and appreciated, status post intramedullary nail -Continue pain control / DC IVF -PT and OT consulted, recommended SNF rehab -consulting social worker today for rehab placement awaiting bed   Urinary tract infection -UA on admission was unremarkable -Patient was taking nitrofurantoin at home  Hyperglycemia -No history of diabetes -Likely secondary to stress, continue to monitor glucose - hemoglobin A1c 5.3  Normocytic anemia -Hemoglobin holding stable.  -Will monitor CBC and obtain anemia panel  Constipation  -opioid induced -plan oral laxatives and follow (see med rec) -continue PT/OT for ambulation  DVT Prophylaxis SCDs  Code Status: Full  Family Communication: None at bedside.  Disposition Plan:  SNF Placement - will consult social worker today 06/16/15  Consultants:  Orthopedics Duda  Procedures:  ORIF Left Hip 06/14/15   Subjective: Pt without complaints today  Objective: Filed Vitals:   06/16/15 1300 06/16/15 2002 06/17/15 0515 06/17/15 1230  BP: 119/63 122/75 125/55 105/62  Pulse: 84 86 86 78  Temp: 98.1 F (36.7 C) 99 F (37.2 C) 98.1 F (36.7 C) 98.6 F (37 C)  TempSrc: Oral Oral Oral Oral  Resp: 16 16 16 16   Weight:      SpO2: 99% 96% 96% 95%    Intake/Output Summary (Last 24 hours) at 06/17/15 1500 Last data filed at 06/17/15 1246  Gross per 24 hour  Intake    480 ml  Output      0 ml  Net    480 ml   Filed Weights    06/14/15 0628  Weight: 180 lb 5.4 oz (81.8 kg)    Exam:  General exam: awake, alert, cooperative, NAD Respiratory system: Clear. No increased work of breathing. Cardiovascular system: S1 & S2 heard, RRR. No JVD, murmurs, gallops, clicks or pedal edema. Gastrointestinal system: Abdomen is mildly distended, soft and nontender. Normal bowel sounds heard. Central nervous system: Alert and oriented. No focal neurological deficits. Extremities: no pretibial edema, Symmetric 5 x 5 power. Bandages clean and dry.   Data Reviewed: Basic Metabolic Panel:  Recent Labs Lab 06/15/15 0453 06/16/15 0459  NA 135 141  K 4.3 3.6  CL 105 106  CO2 25 26  GLUCOSE 148* 102*  BUN 8 6  CREATININE 0.65 0.67  CALCIUM 8.3* 8.2*   Liver Function Tests: No results for input(s): AST, ALT, ALKPHOS, BILITOT, PROT, ALBUMIN in the last 168 hours. No results for input(s): LIPASE, AMYLASE in the last 168 hours. No results for input(s): AMMONIA in the last 168 hours. CBC:  Recent Labs Lab 06/15/15 0453 06/16/15 0459 06/17/15 0400  WBC 7.2 8.4 6.9  HGB 10.6* 9.7* 9.2*  HCT 33.0* 30.2* 28.5*  MCV 89.9 89.9 91.9  PLT 171 167 155   Cardiac Enzymes: No results for input(s): CKTOTAL, CKMB, CKMBINDEX, TROPONINI in the last 168 hours. BNP (last 3 results) No results for input(s): PROBNP in the last 8760 hours. CBG:  Recent Labs Lab 06/16/15 1109 06/16/15 1632 06/16/15 2055 06/17/15 0641 06/17/15 1115  GLUCAP 97 99 110* 108* 106*    Recent Results (  from the past 240 hour(s))  Surgical pcr screen     Status: None   Collection Time: 06/14/15 11:16 AM  Result Value Ref Range Status   MRSA, PCR NEGATIVE NEGATIVE Final   Staphylococcus aureus NEGATIVE NEGATIVE Final    Comment:        The Xpert SA Assay (FDA approved for NASAL specimens in patients over 17 years of age), is one component of a comprehensive surveillance program.  Test performance has been validated by [facility] for  patients greater than or equal to 74 year old. It is not intended to diagnose infection nor to guide or monitor treatment.      Studies: No results found. Scheduled Meds: . aspirin EC  325 mg Oral Q breakfast  . insulin aspart  0-9 Units Subcutaneous TID WC   Continuous Infusions: . sodium chloride    . lactated ringers 50 mL/hr (06/14/15 1513)   Active Problems:   Intertrochanteric fracture of left hip (HCC)   Closed left hip fracture (HCC)   Hyperglycemia   Normocytic anemia   Constipation  Irwin Brakeman, MD, FAAFP [facility] Pager [phone]  If 7PM-7AM, please contact night-coverage [email or web address] Password [email or web address] 06/17/2015, 3:00 PM    LOS: 3 days

## 2015-06-17 NOTE — Progress Notes (Signed)
Physical Therapy Treatment Patient Details Name: [name] MRN: [id number] DOB: 12/19/1945 Today's Date: 06/17/2015    History of Present Illness [name] is a 70 y.o. female who presents with left intertrochanteric hip fracture. Patient is now s/p L IM nailing and TDWB status.    PT Comments    Pt performed increased mobility and required decreased assistance.  Will inform supervising PT of pt progress and need for change in recommendations.  Will continue to follow during acute hopsitalization.    Follow Up Recommendations  SNF;Supervision/Assistance - 24 hour     Equipment Recommendations  Rolling walker with 5" wheels;3in1 (PT)    Recommendations for Other Services       Precautions / Restrictions Precautions Precautions: Fall Restrictions Weight Bearing Restrictions: No LLE Weight Bearing: Touchdown weight bearing Other Position/Activity Restrictions: Went over [employer] restricitions with patient    Mobility  Bed Mobility Overal bed mobility: Needs Assistance Bed Mobility: Supine to Sit     Supine to sit: Supervision;Min guard     General bed mobility comments: Pt remains slow moving but able to complete supine to sit at edge of bed without physical assistance.  Pt required increased time and max VCs to improve ease.  PTA educated pt to use RLE to advance LLE to edge of bed.  Pt able to prop on elbows and walk up to hand to elevate trunk.    Transfers Overall transfer level: Needs assistance Equipment used: Rolling walker (2 wheeled) Transfers: Sit to/from Stand Sit to Stand: Min guard         General transfer comment: Cues for hand placement and sequencing.  PTA educated pt pre transfer on weight bearing status.  Pt able to maintain TDWB in standing and during transition.    Ambulation/Gait Ambulation/Gait assistance: Min guard Ambulation Distance (Feet): 18 Feet Assistive device: Rolling walker (2 wheeled) Gait Pattern/deviations: Step-to  pattern;Decreased stride length;Trunk flexed;Antalgic Gait velocity: Decreased   General Gait Details: Pt performed gait with cues for sequencing and use of UEs to maintain TDWB.  Pt required cues to advance LLE into RW before stepping with RLE.     Stairs            Wheelchair Mobility    Modified Rankin (Stroke Patients Only)       Balance Overall balance assessment: Needs assistance   Sitting balance-Leahy Scale: Good       Standing balance-Leahy Scale: Fair                      Cognition Arousal/Alertness: Awake/alert Behavior During Therapy: WFL for tasks assessed/performed Overall Cognitive Status: Within Functional Limits for tasks assessed                      Exercises Total Joint Exercises Ankle Circles/Pumps: AROM;Both;10 reps;Supine Quad Sets: AROM;Left;10 reps;Supine Gluteal Sets: AROM;Both;10 reps;Supine Heel Slides: Left;10 reps;Supine;AAROM Hip ABduction/ADduction: Left;10 reps;Supine;AAROM Straight Leg Raises: AAROM;Left;10 reps;Supine    General Comments        Pertinent Vitals/Pain Pain Assessment: 0-10 Pain Score: 6  Pain Descriptors / Indicators: Guarding;Grimacing;Operative site guarding Pain Intervention(s): Monitored during session;Repositioned;Limited activity within patient's tolerance    Home Living                      Prior Function            PT Goals (current goals can now be found in the care plan section) Acute Rehab  PT Goals Patient Stated Goal: Home eventually Potential to Achieve Goals: Good Progress towards PT goals: Progressing toward goals    Frequency  Min 3X/week    PT Plan Current plan remains appropriate    Co-evaluation             End of Session Equipment Utilized During Treatment: Gait belt Activity Tolerance: Patient limited by pain Patient left: in chair;with call bell/phone within reach;with chair alarm set;with family/visitor present     Time: [id number] PT  Time Calculation (min) (ACUTE ONLY): 33 min  Charges:  $Therapeutic Exercise: 8-22 mins $Therapeutic Activity: 8-22 mins                    G Codes:      Cristela Blue 2015/07/04, 10:40 AM  Governor Rooks, PTA pager [phone]

## 2015-06-17 NOTE — Clinical Social Work Note (Signed)
Patient has chose [facility] for SNF placement at time of discharge.  Lubertha Sayres, [facility]: [phone] Surgical: [phone]

## 2015-06-18 ENCOUNTER — Inpatient Hospital Stay
Admission: RE | Admit: 2015-06-18 | Discharge: 2015-07-02 | Disposition: A | Discharge status: Home / Self Care | Payer: Medicare Other | Source: Ambulatory Visit | Attending: Internal Medicine | Admitting: Internal Medicine

## 2015-06-18 ENCOUNTER — Encounter (HOSPITAL_COMMUNITY): Payer: Self-pay | Admitting: General Practice

## 2015-06-18 DIAGNOSIS — S72009A Fracture of unspecified part of neck of unspecified femur, initial encounter for closed fracture: Secondary | ICD-10-CM | POA: Diagnosis not present

## 2015-06-18 DIAGNOSIS — Z9181 History of falling: Secondary | ICD-10-CM | POA: Diagnosis not present

## 2015-06-18 DIAGNOSIS — D649 Anemia, unspecified: Secondary | ICD-10-CM | POA: Diagnosis not present

## 2015-06-18 DIAGNOSIS — R293 Abnormal posture: Secondary | ICD-10-CM | POA: Diagnosis not present

## 2015-06-18 DIAGNOSIS — R262 Difficulty in walking, not elsewhere classified: Secondary | ICD-10-CM | POA: Diagnosis not present

## 2015-06-18 DIAGNOSIS — M810 Age-related osteoporosis without current pathological fracture: Secondary | ICD-10-CM | POA: Diagnosis not present

## 2015-06-18 DIAGNOSIS — S72142D Displaced intertrochanteric fracture of left femur, subsequent encounter for closed fracture with routine healing: Secondary | ICD-10-CM | POA: Diagnosis not present

## 2015-06-18 DIAGNOSIS — Z4789 Encounter for other orthopedic aftercare: Secondary | ICD-10-CM | POA: Diagnosis not present

## 2015-06-18 DIAGNOSIS — R739 Hyperglycemia, unspecified: Secondary | ICD-10-CM | POA: Diagnosis not present

## 2015-06-18 DIAGNOSIS — S72002D Fracture of unspecified part of neck of left femur, subsequent encounter for closed fracture with routine healing: Secondary | ICD-10-CM | POA: Diagnosis not present

## 2015-06-18 DIAGNOSIS — M6281 Muscle weakness (generalized): Secondary | ICD-10-CM | POA: Diagnosis not present

## 2015-06-18 LAB — GLUCOSE, CAPILLARY: Glucose-Capillary: 125 mg/dL — ABNORMAL HIGH (ref 65–99)

## 2015-06-18 MED ORDER — HYDROCODONE-ACETAMINOPHEN 5-325 MG PO TABS: 1.0000 | ORAL_TABLET | Freq: Four times a day (QID) | ORAL | Status: DC | PRN

## 2015-06-18 NOTE — Care Management Important Message (Signed)
Important Message  Patient Details  Name: [name] MRN: [id number] Date of Birth: 05-20-1945   Medicare Important Message Given:  Yes    [name] 06/18/2015, 11:11 AM

## 2015-06-18 NOTE — Discharge Summary (Signed)
Physician Discharge Summary  [name]  [id number]  DOB: 06/18/1945  DOA: 06/14/2015  PCP: [name], MD  Admit date: 06/14/2015 Discharge date: 06/18/2015  Time spent: 33 minutes  Recommendations for Outpatient Follow-up:  Please follow up with orthopedics in 2 weeks Please see your PCP in 2 weeks. Discharge Diagnoses:  Active Problems:   Intertrochanteric fracture of left hip (HCC)   Closed left hip fracture (HCC)   Normocytic anemia   Discharge Condition: Improved & Stable  Diet recommendation: resume prior home diet  Filed Weights   06/14/15 0628  Weight: 180 lb 5.4 oz (81.8 kg)     Hospital course: 70 year old with history of nephrolithiasis, ongoing urinary tract infection presented with right hip fracture. Patient underwent surgery on 06/14/2015. Pending PT OT.  Assessment & Plan:  Intertrochanteric fracture of the left hip -Status post mechanical fall -X-rays at [facility] revealed acute comminuted intertrochanteric fracture of the left hip -Orthopedics, consulted and appreciated, status post intramedullary nail -Continue pain control / DC IVF -PT and OT consulted, recommended SNF rehab -consulting social worker today for rehab placement awaiting bed   Urinary tract infection -UA on admission was unremarkable -Patient was taking nitrofurantoin at home  Hyperglycemia -No history of diabetes -Likely secondary to stress, continue to monitor glucose - hemoglobin A1c 5.3  Normocytic anemia -Hemoglobin holding stable.  -Will monitor CBC and obtain anemia panel  Constipation  -opioid induced -plan oral laxatives and follow (see med rec) -continue PT/OT for ambulation  DVT Prophylaxis SCDs  Code Status: Full  Family Communication: None at bedside.  Disposition Plan: SNF Placement - will consult social worker today 06/16/15  Consultants:  Orthopedics Duda  Procedures: ORIF Left Hip 06/14/15  Consultations:  Sharol Given  Orthopedics   Discharge Exam: Pt is ambulating better doing well pain controlled   Filed Vitals:   06/17/15 0515 06/17/15 1230 06/17/15 2027 06/18/15 1132  BP: 125/55 105/62 136/66   Pulse: 86 78 88   Temp: 98.1 F (36.7 C) 98.6 F (37 C) 98.3 F (36.8 C)   TempSrc: Oral Oral Oral   Resp: 16 16 17    Height:    5\' 2"  (1.575 m)  Weight:      SpO2: 96% 95% 97%    General exam: awake, alert, cooperative, NAD Respiratory system: Clear. No increased work of breathing. Cardiovascular system: S1 & S2 heard, RRR. No JVD, murmurs, gallops, clicks or pedal edema. Gastrointestinal system: Abdomen is mildly distended, soft and nontender. Normal bowel sounds heard. Central nervous system: Alert and oriented. No focal neurological deficits. Extremities: no pretibial edema, Symmetric 5 x 5 power. Bandages clean and dry.  Discharge Instructions  Discharge Instructions    Diet - low sodium heart healthy    Complete by:  As directed      Discontinue IV    Complete by:  As directed      Increase activity slowly    Complete by:  As directed      Touch down weight bearing    Complete by:  As directed   Laterality:  left  Extremity:  Lower            Medication List    STOP taking these medications        nitrofurantoin 100 MG capsule  Commonly known as:  MACRODANTIN      TAKE these medications        acetaminophen 500 MG tablet  Commonly known as:  TYLENOL  Take 1 tablet (500  mg total) by mouth every 6 (six) hours as needed for mild pain.     aspirin EC 81 MG tablet  Take 1 tablet (81 mg total) by mouth daily.     HYDROcodone-acetaminophen 5-325 MG tablet  Commonly known as:  NORCO/VICODIN  Take 1 tablet by mouth every 6 (six) hours as needed for moderate pain.     vitamin C 500 MG tablet  Commonly known as:  ASCORBIC ACID  Take 1 tablet (500 mg total) by mouth daily.           Follow-up Information    Follow up with DUDA,MARCUS V, MD In 2 weeks.   Specialty:   Orthopedic Surgery   Contact information:   [place] [id number] [phone]       Follow up with [name], MD In 2 weeks.   Specialty:  Internal Medicine   Contact information:   [place]  [phone]       Get Medicines reviewed and adjusted: Please take all your medications with you for your next visit with your Primary MD  Please request your Primary MD to go over all hospital tests and procedure/radiological results at the follow up. Please ask your Primary MD to get all Hospital records sent to his/her office.  If you experience worsening of your admission symptoms, develop shortness of breath, life threatening emergency, suicidal or homicidal thoughts you must seek medical attention immediately by calling 911 or calling your MD immediately if symptoms less severe.  You must read complete instructions/literature along with all the possible adverse reactions/side effects for all the Medicines you take and that have been prescribed to you. Take any new Medicines after you have completely understood and accept all the possible adverse reactions/side effects.   Do not drive when taking pain medications.   Do not take more than prescribed Pain, Sleep and Anxiety Medications  Special Instructions: If you have smoked or chewed Tobacco in the last 2 yrs please stop smoking, stop any regular Alcohol and or any Recreational drug use.  Wear Seat belts while driving.  Please note  You were cared for by a hospitalist during your hospital stay. Once you are discharged, your primary care physician will handle any further medical issues. Please note that NO REFILLS for any discharge medications will be authorized once you are discharged, as it is imperative that you return to your primary care physician (or establish a relationship with a primary care physician if you do not have one) for your aftercare needs so that they can reassess your  need for medications and monitor your lab values.    The results of significant diagnostics from this hospitalization (including imaging, microbiology, ancillary and laboratory) are listed below for reference.    Significant Diagnostic Studies: Dg Hip Operative Unilat With Pelvis Left  06/14/2015  CLINICAL DATA:  Intra medullary nail placement across left hip fracture. FLUOROSCOPY TIME:  21 seconds. Images: 2. EXAM: OPERATIVE LEFT HIP (WITH PELVIS IF PERFORMED) 2 VIEWS TECHNIQUE: Fluoroscopic spot image(s) were submitted for interpretation post-operatively. COMPARISON:  X-ray from earlier today FINDINGS: An intra medullary nail has been placed across the left intertrochanteric fracture. Hardware is in good position. Alignment is near anatomic on provided images. IMPRESSION: Appropriate intra medullary nail placement. Electronically Signed   By: Dorise Bullion III M.D   On: 06/14/2015 17:38    Microbiology: Recent Results (from the past 240 hour(s))  Surgical pcr screen  Status: None   Collection Time: 06/14/15 11:16 AM  Result Value Ref Range Status   MRSA, PCR NEGATIVE NEGATIVE Final   Staphylococcus aureus NEGATIVE NEGATIVE Final    Comment:        The Xpert SA Assay (FDA approved for NASAL specimens in patients over 105 years of age), is one component of a comprehensive surveillance program.  Test performance has been validated by [facility] for patients greater than or equal to 60 year old. It is not intended to diagnose infection nor to guide or monitor treatment.      Labs: Basic Metabolic Panel:  Recent Labs Lab 06/15/15 0453 06/16/15 0459  NA 135 141  K 4.3 3.6  CL 105 106  CO2 25 26  GLUCOSE 148* 102*  BUN 8 6  CREATININE 0.65 0.67  CALCIUM 8.3* 8.2*   Liver Function Tests: No results for input(s): AST, ALT, ALKPHOS, BILITOT, PROT, ALBUMIN in the last 168 hours. No results for input(s): LIPASE, AMYLASE in the last 168 hours. No results for input(s):  AMMONIA in the last 168 hours. CBC:  Recent Labs Lab 06/15/15 0453 06/16/15 0459 06/17/15 0400  WBC 7.2 8.4 6.9  HGB 10.6* 9.7* 9.2*  HCT 33.0* 30.2* 28.5*  MCV 89.9 89.9 91.9  PLT 171 167 155   Cardiac Enzymes: No results for input(s): CKTOTAL, CKMB, CKMBINDEX, TROPONINI in the last 168 hours. BNP: BNP (last 3 results) No results for input(s): BNP in the last 8760 hours.  ProBNP (last 3 results) No results for input(s): PROBNP in the last 8760 hours.  CBG:  Recent Labs Lab 06/16/15 2055 06/17/15 0641 06/17/15 1115 06/17/15 1621 06/18/15 1156  GLUCAP 110* 108* 106* 107* 125*   Signed:  Irwin Brakeman, MD [facility] Pager [phone]  If 7PM-7AM, please contact night-coverage [email or web address] Password [email or web address] 06/18/2015, 2:50 PM

## 2015-06-18 NOTE — Progress Notes (Signed)
Patient will DC to: [facility] Anticipated DC date: 06/18/15 Family notified: Patient alert and oriented Transport by: Corey Harold   Per MD patient ready for DC to [facility]. RN, patient, patient's family, and facility notified of DC. RN given number for report. DC packet on chart. Ambulance transport requested for patient.   CSW signing off.  Cedric Fishman, [place] Social Worker [phone]

## 2015-06-18 NOTE — Clinical Social Work Placement (Signed)
   CLINICAL SOCIAL WORK PLACEMENT  NOTE  Date:  06/18/2015  Patient Details  Name: [name] MRN: [id number] Date of Birth: 26-May-1945  Clinical Social Work is seeking post-discharge placement for this patient at the [place] level of care (*CSW will initial, date and re-position this form in  chart as items are completed):  Yes   Patient/family provided with [place] Work Department's list of facilities offering this level of care within the geographic area requested by the patient (or if unable, by the patient's family).  Yes   Patient/family informed of their freedom to choose among providers that offer the needed level of care, that participate in Medicare, Medicaid or managed care program needed by the patient, have an available bed and are willing to accept the patient.  Yes   Patient/family informed of [facility]'s ownership interest in [facility] and [facility], as well as of the fact that they are under no obligation to receive care at these facilities.  PASRR submitted to EDS on 06/16/15     PASRR number received on 06/16/15     Existing PASRR number confirmed on       FL2 transmitted to all facilities in geographic area requested by pt/family on 06/16/15     FL2 transmitted to all facilities within larger geographic area on       Patient informed that his/her managed care company has contracts with or will negotiate with certain facilities, including the following:        Yes   Patient/family informed of bed offers received.  Patient chooses bed [facility]     Physician recommends and patient chooses bed at      Patient to be transferred to [facility] on 06/18/15.  Patient to be transferred to facility by [facility]     Patient family notified on 06/18/15 of transfer.  Name of family member notified:  Patient will call family     PHYSICIAN       Additional Comment:     _______________________________________________ Benard Halsted, [place] 06/18/2015, 3:57 PM

## 2015-06-18 NOTE — Progress Notes (Signed)
Physical Therapy Treatment Patient Details Name: [name] MRN: [id number] DOB: July 16, 1945 Today's Date: 06/18/2015    History of Present Illness [name] is a 70 y.o. female who presents with left intertrochanteric hip fracture. Patient is now s/p L IM nailing and TDWB status.    PT Comments    Pt making gradual progress with mobility, able to ambulate 45 ft with rw and min guard assist (taking multiple standing breaks due to fatigue. PT to continue to follow and advance mobility as tolerated.  Follow Up Recommendations  Supervision for mobility/OOB;SNF     Equipment Recommendations  Rolling walker with 5" wheels;3in1 (PT)    Recommendations for Other Services       Precautions / Restrictions Precautions Precautions: Fall Restrictions Weight Bearing Restrictions: Yes LLE Weight Bearing: Touchdown weight bearing    Mobility  Bed Mobility Overal bed mobility: Needs Assistance             General bed mobility comments: pt sitting EOB upon arrival  Transfers Overall transfer level: Needs assistance Equipment used: Rolling walker (2 wheeled) Transfers: Sit to/from Stand Sit to Stand: Min guard         General transfer comment: no cues needed.   Ambulation/Gait Ambulation/Gait assistance: Min guard Ambulation Distance (Feet): 45 Feet Assistive device: Rolling walker (2 wheeled) Gait Pattern/deviations: Step-to pattern Gait velocity: Decreased   General Gait Details: step-to pattern, consistent with TTWB, multiple standing breaks taken.    Stairs            Wheelchair Mobility    Modified Rankin (Stroke Patients Only)       Balance Overall balance assessment: Needs assistance Sitting-balance support: No upper extremity supported Sitting balance-Leahy Scale: Good     Standing balance support: Bilateral upper extremity supported Standing balance-Leahy Scale: Poor Standing balance comment: using rw for support                     Cognition Arousal/Alertness: Awake/alert Behavior During Therapy: WFL for tasks assessed/performed Overall Cognitive Status: Within Functional Limits for tasks assessed                      Exercises Total Joint Exercises Ankle Circles/Pumps: AROM;Both;10 reps;Supine Quad Sets: AROM;Left;10 reps;Supine Gluteal Sets: AROM;Both;10 reps;Supine Heel Slides: Left;10 reps;Supine;AAROM Hip ABduction/ADduction: Left;10 reps;Supine;AAROM    General Comments        Pertinent Vitals/Pain Pain Assessment: 0-10 Pain Score: 6  Pain Location: Lt hip/thigh Pain Descriptors / Indicators: Aching Pain Intervention(s): Limited activity within patient's tolerance;Monitored during session    Home Living Family/patient expects to be discharged to:: Other (Comment) (rehab) Living Arrangements: Spouse/significant other                  Prior Function            PT Goals (current goals can now be found in the care plan section) Acute Rehab PT Goals Patient Stated Goal: Home eventually PT Goal Formulation: With patient/family Time For Goal Achievement: 06/29/15 Potential to Achieve Goals: Good Progress towards PT goals: Progressing toward goals    Frequency  Min 3X/week    PT Plan Current plan remains appropriate    Co-evaluation             End of Session Equipment Utilized During Treatment: Gait belt Activity Tolerance: Patient limited by fatigue Patient left: in chair;with call bell/phone within reach     Time: 1009-1033 PT Time Calculation (min) (ACUTE ONLY):  24 min  Charges:  $Gait Training: 8-22 mins $Therapeutic Exercise: 8-22 mins                    G Codes:      Cassell Clement, PT, CSCS Pager [phone] Office [phone]  06/18/2015, 10:39 AM

## 2015-06-18 NOTE — Discharge Instructions (Signed)
Please be sure to follow up as recommended.  Return for any acute changes or problems.

## 2015-06-18 NOTE — Progress Notes (Signed)
Occupational Therapy Treatment Patient Details Name: [name] MRN: [id number] DOB: 04-04-1945 Today's Date: 06/18/2015    History of present illness [name] is a 70 y.o. female who presents with left intertrochanteric hip fracture. Patient is now s/p L IM nailing and TDWB status.   OT comments  Patient progressing towards OT goals. Continue OT per plan of care.  Follow Up Recommendations  SNF;Supervision/Assistance - 24 hour    Equipment Recommendations  Other (comment) (tbd next venue of care)    Recommendations for Other Services      Precautions / Restrictions Precautions Precautions: Fall Restrictions Weight Bearing Restrictions: Yes LLE Weight Bearing: Touchdown weight bearing       Mobility Bed Mobility Overal bed mobility: Needs Assistance             General bed mobility comments: Up in recliner  Transfers Overall transfer level: Needs assistance Equipment used: Rolling walker (2 wheeled) Transfers: Sit to/from Stand Sit to Stand: Min guard         General transfer comment: no cues needed.     Balance                             ADL Overall ADL's : Needs assistance/impaired Eating/Feeding: Independent;Sitting   Grooming: Set up;Sitting   Upper Body Bathing: Set up;Sitting   Lower Body Bathing: Moderate assistance;Sit to/from stand   Upper Body Dressing : Set up;Sitting   Lower Body Dressing: Moderate assistance;Sit to/from stand   Toilet Transfer: Min guard;Stand-pivot;BSC           Functional mobility during ADLs: Patent attorney      Cognition   Behavior During Therapy: WFL for tasks assessed/performed Overall Cognitive Status: Within Functional Limits for tasks assessed                       Extremity/Trunk Assessment               Exercises    Shoulder Instructions       General Comments       Pertinent Vitals/ Pain       Pain Assessment: 0-10 Pain Score: 5  Pain Location: L hip and thigh Pain Descriptors / Indicators: Aching;Sore Pain Intervention(s): Limited activity within patient's tolerance;Monitored during session;Premedicated before session  Home Living Family/patient expects to be discharged to:: Other (Comment) (rehab) Living Arrangements: Spouse/significant other                                      Prior Functioning/Environment              Frequency Min 2X/week     Progress Toward Goals  OT Goals(current goals can now be found in the care plan section)  Progress towards OT goals: Progressing toward goals  Acute Rehab OT Goals Patient Stated Goal: Home eventually  Plan Discharge plan remains appropriate    Co-evaluation                 End of Session Equipment Utilized During Treatment: Rolling walker   Activity Tolerance Patient tolerated treatment well   Patient Left in chair;with call bell/phone  within reach   Nurse Communication          Time: [id number] OT Time Calculation (min): 15 min  Charges: OT General Charges $OT Visit: 1 Procedure OT Treatments $Self Care/Home Management : 8-22 mins  Charli Halle A 06/18/2015, 1:24 PM

## 2015-06-19 ENCOUNTER — Other Ambulatory Visit (HOSPITAL_COMMUNITY)
Admission: RE | Admit: 2015-06-19 | Discharge: 2015-06-19 | Disposition: A | Discharge status: Home / Self Care | Payer: Medicare Other | Source: Skilled Nursing Facility | Attending: Internal Medicine | Admitting: Internal Medicine

## 2015-06-19 LAB — CBC WITH DIFFERENTIAL/PLATELET
BASOS ABS: 0 10*3/uL (ref 0.0–0.1)
Basophils Relative: 0 %
Eosinophils Absolute: 0.2 10*3/uL (ref 0.0–0.7)
Eosinophils Relative: 3 %
HEMATOCRIT: 33.7 % — AB (ref 36.0–46.0)
Hemoglobin: 11.5 g/dL — ABNORMAL LOW (ref 12.0–15.0)
LYMPHS ABS: 1.2 10*3/uL (ref 0.7–4.0)
LYMPHS PCT: 16 %
MCH: 30.7 pg (ref 26.0–34.0)
MCHC: 34.1 g/dL (ref 30.0–36.0)
MCV: 89.9 fL (ref 78.0–100.0)
MONO ABS: 0.8 10*3/uL (ref 0.1–1.0)
MONOS PCT: 11 %
NEUTROS ABS: 5 10*3/uL (ref 1.7–7.7)
Neutrophils Relative %: 70 %
Platelets: 258 10*3/uL (ref 150–400)
RBC: 3.75 MIL/uL — ABNORMAL LOW (ref 3.87–5.11)
RDW: 13.4 % (ref 11.5–15.5)
WBC: 7.2 10*3/uL (ref 4.0–10.5)

## 2015-06-21 ENCOUNTER — Non-Acute Institutional Stay (SKILLED_NURSING_FACILITY): Payer: Medicare Other | Admitting: Internal Medicine

## 2015-06-21 ENCOUNTER — Encounter: Payer: Self-pay | Admitting: Internal Medicine

## 2015-06-21 DIAGNOSIS — S72002D Fracture of unspecified part of neck of left femur, subsequent encounter for closed fracture with routine healing: Secondary | ICD-10-CM | POA: Diagnosis not present

## 2015-06-21 DIAGNOSIS — D649 Anemia, unspecified: Secondary | ICD-10-CM

## 2015-06-21 NOTE — Progress Notes (Signed)
This is a comprehensive admission note to [facility] personally performed by Unice Cobble MD on this date less than 30 days from date of admission. Included are preadmission medical/surgical history;reconciled medication list; family history; social history and comprehensive review of systems.  Corrections and additions to the records were documented . Comprehensive physical exam was also performed. Additionally a clinical summary was entered for each active diagnosis pertinent to this admission in the Problem List to enhance continuity of care.  PCP: Jerene Bears, MD  HPI: The patient was hospitalized/5-06/18/15 for surgical treatment of intertrochanteric fracture of the left hip 06/14/15 with intramedullary nailing. The fracture was sustained in a mechanical fall; there was no cardiac or neurologic prodrome. Denied were any change in heart rhythm or rate prior to the event. There was no associated chest pain or shortness of breath . Also specifically denied prior to the episode were headache, limb weakness, tingling, or numbness. No seizure activity noted.  She had opioid associated constipation and anemia post op. Skilled nursing placement was recommended for physical therapy.  Past medical and surgical history: Remotely she had a radius fracture. She states that her bone densities have not revealed any osteoporosis. She's had renal calculi on 2 occasions.  Social history: She quit smoking in 1988 when her doctor warned her of the risk of emphysema were she to continue smoking.  Family history: Negative except for CNS cancer in her father.  Comprehensive review of systems is totally negative except for constipation from the opioids postop. Constitutional: No fever,significant weight change, fatigue  Eyes: No redness, discharge, pain, vision change ENT/mouth: No nasal congestion,  purulent discharge, earache,change in hearing ,sore throat  Cardiovascular: No chest pain,  palpitations,paroxysmal nocturnal dyspnea, claudication, edema  Respiratory: No cough, sputum production,hemoptysis, DOE , significant snoring,apnea   Gastrointestinal: No heartburn,dysphagia,abdominal pain, nausea / vomiting,rectal bleeding, melena Genitourinary: No dysuria,hematuria, pyuria,  incontinence, nocturia Musculoskeletal: No joint stiffness, joint swelling, weakness,pain Dermatologic: No rash, pruritus, change in appearance of skin Neurologic: No dizziness,headache,syncope, seizures, numbness , tingling Psychiatric: No significant anxiety , depression, insomnia, anorexia Endocrine: No change in hair/skin/ nails, excessive thirst, excessive hunger, excessive urination  Hematologic/lymphatic: No significant bruising, lymphadenopathy,abnormal bleeding Allergy/immunology: No itchy/ watery eyes, significant sneezing, urticaria, angioedema  Physical exam:  Pertinent or positive findings: flexion contracture left fifth finger. Decreased pedal pulses especially dorsalis pedis pulses. Ambulating with a rolling walker. General appearance:Adequately nourished; no acute distress , increased work of breathing is present.   Lymphatic: No lymphadenopathy about the head, neck, axilla . Eyes: No conjunctival inflammation or lid edema is present. There is no scleral icterus. Ears:  External ear exam shows no significant lesions or deformities.   Nose:  External nasal examination shows no deformity or inflammation. Nasal mucosa are pink and moist without lesions ,exudates Oral exam: lips and gums are healthy appearing.There is no oropharyngeal erythema or exudate . Neck:  No thyromegaly, masses, tenderness noted.    Heart:  Normal rate and regular rhythm. S1 and S2 normal without gallop, murmur, click, rub .  Lungs:Chest clear to auscultation without wheezes, rhonchi,rales , rubs. Abdomen:Bowel sounds are normal. Abdomen is soft and nontender with no organomegaly, hernias,masses. GU: deferred as  previously addressed. Extremities:  No cyanosis, clubbing,edema  Neurologic exam : Strength equal  in upper & lower extremities Balance,Rhomberg,finger to nose testing could not be completed due to clinical state Deep tendon reflexes are equal;1.5+ @ knees Skin: Warm & dry w/o tenting. No significant lesions or rash.  See clinical summary under each active problem in the Problem List with associated updated therapeutic plan

## 2015-06-21 NOTE — Assessment & Plan Note (Signed)
PT/OT at [facility]

## 2015-06-21 NOTE — Patient Instructions (Signed)
No new orders

## 2015-06-21 NOTE — Assessment & Plan Note (Signed)
06/19/15 Anemia improving hemoglobin 11.5/hematocrit 33.7

## 2015-06-30 ENCOUNTER — Other Ambulatory Visit: Payer: Self-pay | Admitting: *Deleted

## 2015-06-30 MED ORDER — HYDROCODONE-ACETAMINOPHEN 5-325 MG PO TABS: 1.0000 | ORAL_TABLET | Freq: Four times a day (QID) | ORAL | Status: DC | PRN

## 2015-07-02 ENCOUNTER — Non-Acute Institutional Stay (SKILLED_NURSING_FACILITY): Payer: Medicare Other | Admitting: Internal Medicine

## 2015-07-02 ENCOUNTER — Encounter: Payer: Self-pay | Admitting: Internal Medicine

## 2015-07-02 DIAGNOSIS — S72002A Fracture of unspecified part of neck of left femur, initial encounter for closed fracture: Secondary | ICD-10-CM | POA: Insufficient documentation

## 2015-07-02 DIAGNOSIS — D649 Anemia, unspecified: Secondary | ICD-10-CM | POA: Diagnosis not present

## 2015-07-02 DIAGNOSIS — S72002D Fracture of unspecified part of neck of left femur, subsequent encounter for closed fracture with routine healing: Secondary | ICD-10-CM

## 2015-07-02 NOTE — Progress Notes (Signed)
Location:    Nursing Home Room Number: 120/W Place of Service:  SNF (31) Provider:  Imogene Burn, MD  Patient Care Team: [name], MD as PCP - General (Internal Medicine)  Extended Emergency Contact Information Primary Emergency Contact: Hoy Register Address: [address] [place], [place] [address] Johnnette Litter of [place] Phone: [phone] Mobile Phone: [phone] Relation: Spouse Secondary Emergency Contact: [place], [place] [address] Johnnette Litter of [place] Phone: [phone] Mobile Phone: [phone] Relation: Daughter  Code Status:  Full Code Goals of care: Advanced Directive information Advanced Directives 07/02/2015  Does patient have an advance directive? Yes  Does patient want to make changes to advanced directive? No - Patient declined  Copy of advanced directive(s) in chart? Yes  Would patient like information on creating an advanced directive? -     Chief Complaint  Patient presents with  . Discharge Note    HPI:  Pt is a 70 y.o. female seen today for discharge -She was hospitalized earlier this month for surgical treatment of an i left hip fracture with IM nailing.  She sustained a fracture after fall.  There was no cardiac or neurologic appears etiology.  Postop she had opiate associated constipation and anemia this apparently resolved unremarkably.  Her stay here is been quite unremarkable she has worked rehabilitation she actually saw her orthopedic surgeon today who has given her the okay to go home.  She will need PT and OT-she can receive this apparently from home health-.  Today she has no acute complaints vital signs have been stable she did have postop anemia but this has risen as well as with most recent hemoglobin of 11.5 on June 10.  She is looking forward to going home she is using a walker now-pain appears to be controlled if    Past Medical History  Diagnosis Date  . Distal radius  fracture, right     mechanical fall  . Hip fracture, left ([place])     2017  . UTI (lower urinary tract infection)     06/2015  . Kidney stone     X 2  . Hyperglycemia    Past Surgical History  Procedure Laterality Date  . Cardiac catheterization  2005    [facility]report in [place] in epic  . Hemorrhoid surgery    . Cervical fusion  2006  . Tubal ligation    . Colonoscopy    . Finger arthroplasty  9/13    lt long and index  . Tenolysis Left 05/02/2012    Procedure: LEFT LONG TENOLYSIS CAPSULE RELEASES MANIPULATION OF IP/MP JOINTS;  Surgeon: Cammie Sickle., MD;  Location: [place];  Service: Orthopedics;  Laterality: Left;  . Open reduction internal fixation (orif) distal radial fracture Right 06/25/2014    Procedure: OPEN REDUCTION INTERNAL FIXATION (ORIF) RIGHT  DISTAL RADIUS FRACTURE;  Surgeon: Leanora Cover, MD;  Location: [place];  Service: Orthopedics;  Laterality: Right;  . Colonoscopy N/A 08/19/2014    Procedure: COLONOSCOPY;  Surgeon: Rogene Houston, MD;  [facility];  Service: Endoscopy;  Laterality: N/A;  830  . Intramedullary (im) nail intertrochanteric Left 06/14/2015    Procedure: INTRAMEDULLARY (IM) NAIL INTERTROCHANTRIC;  Surgeon: Newt Minion, MD;  Location: [place];  Service: Orthopedics;  Laterality: Left;    Allergies  Allergen Reactions  . Sulfa  Antibiotics Rash  . Penicillins Hives  . Other Rash    walnut    Review of Systems   In general does not complaining fever or chills.  Skin does not complain of rashes or itching.  Head ears eyes nose mouth and throat does not complain of visual changes sore throat.  Respiratory denies shortness breath or cough.  Cardiac no chest pain minimal lower extremity edema.  GI is not complaining of abdominal pain nausea vomiting diarrhea constipation.  Musculoskeletal is not really complaining of joint pain despite the recent hip repair-she is using a walker at this  time.  Neurologic is not complaining of dizziness headache or numbness.  In psych she is not complaining of any overt anxiety or depression.   Current Outpatient Prescriptions on File Prior to Visit  Medication Sig Dispense Refill  . acetaminophen (TYLENOL) 500 MG tablet Take 1 tablet (500 mg total) by mouth every 6 (six) hours as needed for mild pain. 30 tablet 0  . aspirin EC 81 MG tablet Take 1 tablet (81 mg total) by mouth daily. 30 tablet 1  . HYDROcodone-acetaminophen (NORCO/VICODIN) 5-325 MG tablet Take 1 tablet by mouth every 6 (six) hours as needed for moderate pain. 120 tablet 0  . vitamin C (ASCORBIC ACID) 500 MG tablet Take 1 tablet (500 mg total) by mouth daily. 30 tablet 0   No current facility-administered medications on file prior to visit.     There is no immunization history on file for this patient. Pertinent  Health Maintenance Due  Topic Date Due  . DEXA SCAN  07/01/2016 (Originally 04/06/2011)  . PNA vac Low Risk Adult (1 of 2 - PCV13) 07/01/2016 (Originally 04/06/2011)  . MAMMOGRAM  07/01/2017 (Originally 04/05/1996)  . INFLUENZA VACCINE  08/10/2015  . COLONOSCOPY  08/18/2024   No flowsheet data found. Functional Status Survey:    Filed Vitals:   07/02/15 1427  BP: 129/62  Pulse: 79  Temp: 98 F (36.7 C)  TempSrc: Oral  Resp: 20  Height: 5\' 2"  (1.575 m)  Weight: 136 lb 9.6 oz (61.961 kg)   Body mass index is 24.98 kg/(m^2). Physical Exam   In general this is a very pleasant elderly female who looks younger than her stated age.  Her skin is warm and dry.  Eyes she has prescription lenses visual acuity appears grossly intact.  Chest is clear to auscultation there is no labored breathing.  Heart is regular rate and rhythm without murmur gallop or rub she does not have significant lower extremity edema.  Abdomen is soft nontender positive bowel sounds.  Muscle skeletal moves all extremities 4 she has compression hose on lower extremities    bilaterally pedal pulse is intact-she is able to ambulate with a walker.--Has flexion contracture of her left fifth finger  Neurologic is grossly intact with no lateralizing findings her speech is clear.  Psych she is alert and oriented pleasant and appropriate.    Labs reviewed:  Recent Labs  06/15/15 0453 06/16/15 0459  NA 135 141  K 4.3 3.6  CL 105 106  CO2 25 26  GLUCOSE 148* 102*  BUN 8 6  CREATININE 0.65 0.67  CALCIUM 8.3* 8.2*   No results for input(s): AST, ALT, ALKPHOS, BILITOT, PROT, ALBUMIN in the last 8760 hours.  Recent Labs  06/16/15 0459 06/17/15 0400 06/19/15 0745  WBC 8.4 6.9 7.2  NEUTROABS  --   --  5.0  HGB 9.7* 9.2* 11.5*  HCT 30.2* 28.5* 33.7*  MCV 89.9 91.9 89.9  PLT 167 155 258   No results found for: TSH Lab Results  Component Value Date   HGBA1C 5.3 06/15/2015   No results found for: CHOL, HDL, LDLCALC, LDLDIRECT, TRIG, CHOLHDL  Significant Diagnostic Results in last 30 days:  Dg Hip Operative Unilat With Pelvis Left  06/14/2015  CLINICAL DATA:  Intra medullary nail placement across left hip fracture. FLUOROSCOPY TIME:  21 seconds. Images: 2. EXAM: OPERATIVE LEFT HIP (WITH PELVIS IF PERFORMED) 2 VIEWS TECHNIQUE: Fluoroscopic spot image(s) were submitted for interpretation post-operatively. COMPARISON:  X-ray from earlier today FINDINGS: An intra medullary nail has been placed across the left intertrochanteric fracture. Hardware is in good position. Alignment is near anatomic on provided images. IMPRESSION: Appropriate intra medullary nail placement. Electronically Signed   By: Dorise Bullion III M.D   On: 06/14/2015 17:38    Assessment/Plan  #History of right intertrochanteric fracture  of the left hip-she is made good progress here she has worked with therapy-has gained strength-orthopedics is comfortable with her going home-she will be discharged with home health PT and OT-she did receive all her clinic from orthopedics.  #2-history of  anemia suspect there was a postop etiology this has risen with a hemoglobin of  11.5 on lab done 06/19/2015 will defer follow-up to primary care provider.  [place], [place], [place]

## 2015-07-05 DIAGNOSIS — W19XXXD Unspecified fall, subsequent encounter: Secondary | ICD-10-CM | POA: Diagnosis not present

## 2015-07-05 DIAGNOSIS — K5903 Drug induced constipation: Secondary | ICD-10-CM | POA: Diagnosis not present

## 2015-07-05 DIAGNOSIS — S72142D Displaced intertrochanteric fracture of left femur, subsequent encounter for closed fracture with routine healing: Secondary | ICD-10-CM | POA: Diagnosis not present

## 2015-07-06 DIAGNOSIS — S72142D Displaced intertrochanteric fracture of left femur, subsequent encounter for closed fracture with routine healing: Secondary | ICD-10-CM | POA: Diagnosis not present

## 2015-07-06 DIAGNOSIS — W19XXXD Unspecified fall, subsequent encounter: Secondary | ICD-10-CM | POA: Diagnosis not present

## 2015-07-06 DIAGNOSIS — K5903 Drug induced constipation: Secondary | ICD-10-CM | POA: Diagnosis not present

## 2015-07-08 DIAGNOSIS — K5903 Drug induced constipation: Secondary | ICD-10-CM | POA: Diagnosis not present

## 2015-07-08 DIAGNOSIS — S72142D Displaced intertrochanteric fracture of left femur, subsequent encounter for closed fracture with routine healing: Secondary | ICD-10-CM | POA: Diagnosis not present

## 2015-07-08 DIAGNOSIS — W19XXXD Unspecified fall, subsequent encounter: Secondary | ICD-10-CM | POA: Diagnosis not present

## 2015-07-12 DIAGNOSIS — S72142D Displaced intertrochanteric fracture of left femur, subsequent encounter for closed fracture with routine healing: Secondary | ICD-10-CM | POA: Diagnosis not present

## 2015-07-12 DIAGNOSIS — W19XXXD Unspecified fall, subsequent encounter: Secondary | ICD-10-CM | POA: Diagnosis not present

## 2015-07-12 DIAGNOSIS — K5903 Drug induced constipation: Secondary | ICD-10-CM | POA: Diagnosis not present

## 2015-07-14 DIAGNOSIS — S72142D Displaced intertrochanteric fracture of left femur, subsequent encounter for closed fracture with routine healing: Secondary | ICD-10-CM | POA: Diagnosis not present

## 2015-07-14 DIAGNOSIS — K5903 Drug induced constipation: Secondary | ICD-10-CM | POA: Diagnosis not present

## 2015-07-14 DIAGNOSIS — W19XXXD Unspecified fall, subsequent encounter: Secondary | ICD-10-CM | POA: Diagnosis not present

## 2015-07-16 DIAGNOSIS — W19XXXD Unspecified fall, subsequent encounter: Secondary | ICD-10-CM | POA: Diagnosis not present

## 2015-07-16 DIAGNOSIS — K5903 Drug induced constipation: Secondary | ICD-10-CM | POA: Diagnosis not present

## 2015-07-16 DIAGNOSIS — S72142D Displaced intertrochanteric fracture of left femur, subsequent encounter for closed fracture with routine healing: Secondary | ICD-10-CM | POA: Diagnosis not present

## 2015-07-19 DIAGNOSIS — W19XXXD Unspecified fall, subsequent encounter: Secondary | ICD-10-CM | POA: Diagnosis not present

## 2015-07-19 DIAGNOSIS — K5903 Drug induced constipation: Secondary | ICD-10-CM | POA: Diagnosis not present

## 2015-07-19 DIAGNOSIS — S72142D Displaced intertrochanteric fracture of left femur, subsequent encounter for closed fracture with routine healing: Secondary | ICD-10-CM | POA: Diagnosis not present

## 2015-07-21 DIAGNOSIS — K5903 Drug induced constipation: Secondary | ICD-10-CM | POA: Diagnosis not present

## 2015-07-21 DIAGNOSIS — S72142D Displaced intertrochanteric fracture of left femur, subsequent encounter for closed fracture with routine healing: Secondary | ICD-10-CM | POA: Diagnosis not present

## 2015-07-21 DIAGNOSIS — W19XXXD Unspecified fall, subsequent encounter: Secondary | ICD-10-CM | POA: Diagnosis not present

## 2015-07-26 DIAGNOSIS — S72142D Displaced intertrochanteric fracture of left femur, subsequent encounter for closed fracture with routine healing: Secondary | ICD-10-CM | POA: Diagnosis not present

## 2015-07-26 DIAGNOSIS — W19XXXD Unspecified fall, subsequent encounter: Secondary | ICD-10-CM | POA: Diagnosis not present

## 2015-07-26 DIAGNOSIS — K5903 Drug induced constipation: Secondary | ICD-10-CM | POA: Diagnosis not present

## 2015-07-28 DIAGNOSIS — K5903 Drug induced constipation: Secondary | ICD-10-CM | POA: Diagnosis not present

## 2015-07-28 DIAGNOSIS — S72142D Displaced intertrochanteric fracture of left femur, subsequent encounter for closed fracture with routine healing: Secondary | ICD-10-CM | POA: Diagnosis not present

## 2015-07-28 DIAGNOSIS — W19XXXD Unspecified fall, subsequent encounter: Secondary | ICD-10-CM | POA: Diagnosis not present

## 2015-08-02 DIAGNOSIS — S72142D Displaced intertrochanteric fracture of left femur, subsequent encounter for closed fracture with routine healing: Secondary | ICD-10-CM | POA: Diagnosis not present

## 2015-08-04 DIAGNOSIS — K5903 Drug induced constipation: Secondary | ICD-10-CM | POA: Diagnosis not present

## 2015-08-04 DIAGNOSIS — S72142D Displaced intertrochanteric fracture of left femur, subsequent encounter for closed fracture with routine healing: Secondary | ICD-10-CM | POA: Diagnosis not present

## 2015-08-04 DIAGNOSIS — W19XXXD Unspecified fall, subsequent encounter: Secondary | ICD-10-CM | POA: Diagnosis not present

## 2015-08-05 DIAGNOSIS — S72142D Displaced intertrochanteric fracture of left femur, subsequent encounter for closed fracture with routine healing: Secondary | ICD-10-CM | POA: Diagnosis not present

## 2015-08-05 DIAGNOSIS — K5903 Drug induced constipation: Secondary | ICD-10-CM | POA: Diagnosis not present

## 2015-08-05 DIAGNOSIS — W19XXXD Unspecified fall, subsequent encounter: Secondary | ICD-10-CM | POA: Diagnosis not present

## 2015-08-11 DIAGNOSIS — S72142D Displaced intertrochanteric fracture of left femur, subsequent encounter for closed fracture with routine healing: Secondary | ICD-10-CM | POA: Diagnosis not present

## 2015-08-11 DIAGNOSIS — W19XXXD Unspecified fall, subsequent encounter: Secondary | ICD-10-CM | POA: Diagnosis not present

## 2015-08-11 DIAGNOSIS — K5903 Drug induced constipation: Secondary | ICD-10-CM | POA: Diagnosis not present

## 2015-08-13 DIAGNOSIS — K5903 Drug induced constipation: Secondary | ICD-10-CM | POA: Diagnosis not present

## 2015-08-13 DIAGNOSIS — W19XXXD Unspecified fall, subsequent encounter: Secondary | ICD-10-CM | POA: Diagnosis not present

## 2015-08-13 DIAGNOSIS — S72142D Displaced intertrochanteric fracture of left femur, subsequent encounter for closed fracture with routine healing: Secondary | ICD-10-CM | POA: Diagnosis not present

## 2015-09-22 DIAGNOSIS — N63 Unspecified lump in breast: Secondary | ICD-10-CM | POA: Diagnosis not present

## 2015-10-13 ENCOUNTER — Ambulatory Visit (INDEPENDENT_AMBULATORY_CARE_PROVIDER_SITE_OTHER): Payer: Medicare Other | Admitting: Orthopedic Surgery

## 2015-11-15 ENCOUNTER — Telehealth (INDEPENDENT_AMBULATORY_CARE_PROVIDER_SITE_OTHER): Payer: Self-pay | Admitting: *Deleted

## 2015-11-15 NOTE — Telephone Encounter (Signed)
Called and sw pt. Advised that we have not seen her in the office since August and her surgery was back in June. If she was having pain that required the use of Vicodin that we needed to see her in the office to evaluate where her pain was coming from. Appt sch for 11/24/15. Pt voiced understanding and will call with questions of if she is unable to make the appt

## 2015-11-15 NOTE — Telephone Encounter (Signed)
Pt. Called for a prescription refill for hydrocodone.

## 2015-11-24 ENCOUNTER — Ambulatory Visit (INDEPENDENT_AMBULATORY_CARE_PROVIDER_SITE_OTHER): Payer: Medicare Other | Admitting: Orthopedic Surgery

## 2016-02-07 DIAGNOSIS — N3000 Acute cystitis without hematuria: Secondary | ICD-10-CM | POA: Diagnosis not present

## 2016-02-07 DIAGNOSIS — Z960 Presence of urogenital implants: Secondary | ICD-10-CM | POA: Diagnosis not present

## 2016-02-07 DIAGNOSIS — N390 Urinary tract infection, site not specified: Secondary | ICD-10-CM | POA: Diagnosis not present

## 2016-04-30 DIAGNOSIS — Z8249 Family history of ischemic heart disease and other diseases of the circulatory system: Secondary | ICD-10-CM | POA: Diagnosis not present

## 2016-04-30 DIAGNOSIS — M79604 Pain in right leg: Secondary | ICD-10-CM | POA: Diagnosis not present

## 2016-04-30 DIAGNOSIS — I8001 Phlebitis and thrombophlebitis of superficial vessels of right lower extremity: Secondary | ICD-10-CM | POA: Diagnosis not present

## 2016-04-30 DIAGNOSIS — M7989 Other specified soft tissue disorders: Secondary | ICD-10-CM | POA: Diagnosis not present

## 2016-05-01 DIAGNOSIS — I8391 Asymptomatic varicose veins of right lower extremity: Secondary | ICD-10-CM | POA: Diagnosis not present

## 2016-05-01 DIAGNOSIS — I82811 Embolism and thrombosis of superficial veins of right lower extremities: Secondary | ICD-10-CM | POA: Diagnosis not present

## 2016-05-17 DIAGNOSIS — Z789 Other specified health status: Secondary | ICD-10-CM | POA: Diagnosis not present

## 2016-05-17 DIAGNOSIS — Z6829 Body mass index (BMI) 29.0-29.9, adult: Secondary | ICD-10-CM | POA: Diagnosis not present

## 2016-05-17 DIAGNOSIS — Z713 Dietary counseling and surveillance: Secondary | ICD-10-CM | POA: Diagnosis not present

## 2016-05-17 DIAGNOSIS — Z299 Encounter for prophylactic measures, unspecified: Secondary | ICD-10-CM | POA: Diagnosis not present

## 2016-05-17 DIAGNOSIS — I8001 Phlebitis and thrombophlebitis of superficial vessels of right lower extremity: Secondary | ICD-10-CM | POA: Diagnosis not present

## 2016-06-13 DIAGNOSIS — S72002A Fracture of unspecified part of neck of left femur, initial encounter for closed fracture: Secondary | ICD-10-CM | POA: Diagnosis not present

## 2016-06-20 DIAGNOSIS — Z7189 Other specified counseling: Secondary | ICD-10-CM | POA: Diagnosis not present

## 2016-06-20 DIAGNOSIS — I8001 Phlebitis and thrombophlebitis of superficial vessels of right lower extremity: Secondary | ICD-10-CM | POA: Diagnosis not present

## 2016-06-20 DIAGNOSIS — R5383 Other fatigue: Secondary | ICD-10-CM | POA: Diagnosis not present

## 2016-06-20 DIAGNOSIS — Z1211 Encounter for screening for malignant neoplasm of colon: Secondary | ICD-10-CM | POA: Diagnosis not present

## 2016-06-20 DIAGNOSIS — Z6830 Body mass index (BMI) 30.0-30.9, adult: Secondary | ICD-10-CM | POA: Diagnosis not present

## 2016-06-20 DIAGNOSIS — Z299 Encounter for prophylactic measures, unspecified: Secondary | ICD-10-CM | POA: Diagnosis not present

## 2016-06-20 DIAGNOSIS — E7800 Pure hypercholesterolemia, unspecified: Secondary | ICD-10-CM | POA: Diagnosis not present

## 2016-06-20 DIAGNOSIS — Z0000 Encounter for general adult medical examination without abnormal findings: Secondary | ICD-10-CM | POA: Diagnosis not present

## 2016-06-20 DIAGNOSIS — Z1389 Encounter for screening for other disorder: Secondary | ICD-10-CM | POA: Diagnosis not present

## 2016-06-21 DIAGNOSIS — E7800 Pure hypercholesterolemia, unspecified: Secondary | ICD-10-CM | POA: Diagnosis not present

## 2016-06-21 DIAGNOSIS — R5383 Other fatigue: Secondary | ICD-10-CM | POA: Diagnosis not present

## 2016-07-18 DIAGNOSIS — M8580 Other specified disorders of bone density and structure, unspecified site: Secondary | ICD-10-CM | POA: Diagnosis not present

## 2016-07-18 DIAGNOSIS — E2839 Other primary ovarian failure: Secondary | ICD-10-CM | POA: Diagnosis not present

## 2016-10-02 DIAGNOSIS — Z713 Dietary counseling and surveillance: Secondary | ICD-10-CM | POA: Diagnosis not present

## 2016-10-02 DIAGNOSIS — M25461 Effusion, right knee: Secondary | ICD-10-CM | POA: Diagnosis not present

## 2016-10-02 DIAGNOSIS — M25561 Pain in right knee: Secondary | ICD-10-CM | POA: Diagnosis not present

## 2016-10-02 DIAGNOSIS — M25661 Stiffness of right knee, not elsewhere classified: Secondary | ICD-10-CM | POA: Diagnosis not present

## 2016-10-02 DIAGNOSIS — Z6830 Body mass index (BMI) 30.0-30.9, adult: Secondary | ICD-10-CM | POA: Diagnosis not present

## 2016-10-02 DIAGNOSIS — M76891 Other specified enthesopathies of right lower limb, excluding foot: Secondary | ICD-10-CM | POA: Diagnosis not present

## 2016-10-02 DIAGNOSIS — Z299 Encounter for prophylactic measures, unspecified: Secondary | ICD-10-CM | POA: Diagnosis not present

## 2016-12-04 DIAGNOSIS — Z1231 Encounter for screening mammogram for malignant neoplasm of breast: Secondary | ICD-10-CM | POA: Diagnosis not present

## 2016-12-06 ENCOUNTER — Ambulatory Visit (INDEPENDENT_AMBULATORY_CARE_PROVIDER_SITE_OTHER): Payer: Medicare Other | Admitting: Family

## 2016-12-06 ENCOUNTER — Encounter (INDEPENDENT_AMBULATORY_CARE_PROVIDER_SITE_OTHER): Payer: Self-pay | Admitting: Family

## 2016-12-06 ENCOUNTER — Ambulatory Visit (INDEPENDENT_AMBULATORY_CARE_PROVIDER_SITE_OTHER): Payer: Medicare Other

## 2016-12-06 VITALS — Ht 62.0 in | Wt 136.0 lb

## 2016-12-06 DIAGNOSIS — M25552 Pain in left hip: Secondary | ICD-10-CM | POA: Diagnosis not present

## 2016-12-06 DIAGNOSIS — S72002D Fracture of unspecified part of neck of left femur, subsequent encounter for closed fracture with routine healing: Secondary | ICD-10-CM

## 2016-12-06 NOTE — Progress Notes (Signed)
Office Visit Note   Patient: [name]           Date of Birth: [birth year]           MRN: [id number] Visit Date: 12/06/2016              Requested by: [name], MD [place], [place] [address] PCP: [name], MD  Chief Complaint  Patient presents with  . Left Hip - Pain    IM nail left hip 06/2015      HPI: Patient is a 71 year old woman seen today for evaluation of left hip and thigh pain.  She is status post IM nailing for hip fracture June 217.  She states she is very active has been going to the gym several times a week.  Her pain is primarily in her mid thigh wonders if it is in the muscle pain primarily at rest does have increased pain with weightbearing.  Increased pain at night discomfort in bed.  No pain with palpation of her thigh.  Assessment & Plan: Visit Diagnoses:  1. Pain in left hip   2. Closed fracture of left hip with routine healing, subsequent encounter     Plan: Provided with a sample for pen said.  Encouraged her to continue with anti-inflammatories and her exercise program.  Reassurance provided no hardware failure.  She will follow-up in office as needed.  Follow-Up Instructions: No Follow-up on file.   Left Hip Exam  Left hip exam is normal.  Tenderness  The patient is experiencing no tenderness.   Range of Motion  The patient has normal left hip ROM.  Muscle Strength  The patient has normal left hip strength.   Tests  FABER: negative  Comments:  Fluid easy range of motion of hip is pain-free.  Femur nontender.      Patient is alert, oriented, no adenopathy, well-dressed, normal affect, normal respiratory effort.   Imaging: No results found. No images are attached to the encounter.  Labs: Lab Results  Component Value Date   HGBA1C 5.3 06/15/2015    @LABSALLVALUES (HGBA1)@  @BMI1 @  Orders:  Orders Placed This Encounter  Procedures  . XR HIP UNILAT W OR W/O PELVIS 2-3 VIEWS LEFT   No orders of the  defined types were placed in this encounter.    Procedures: No procedures performed  Clinical Data: No additional findings.  ROS:  All other systems negative, except as noted in the HPI. Review of Systems  Constitutional: Negative for chills and fever.  Musculoskeletal: Positive for arthralgias and myalgias. Negative for joint swelling.    Objective: Vital Signs: Ht 5\' 2"  (1.575 m)   Wt 136 lb (61.7 kg)   BMI 24.87 kg/m   Specialty Comments:  No specialty comments available.  PMFS History: Patient Active Problem List   Diagnosis Date Noted  . Closed left hip fracture ([place]) 07/02/2015  . Anemia, unspecified 07/02/2015   Past Medical History:  Diagnosis Date  . Distal radius fracture, right    mechanical fall  . Hip fracture, left ([place])    2017  . Hyperglycemia   . Kidney stone    X 2  . UTI (lower urinary tract infection)    06/2015    Family History  Problem Relation Age of Onset  . Cancer Father        cns  . Diabetes Neg Hx   . Heart disease Neg Hx   . Stroke Neg Hx  Past Surgical History:  Procedure Laterality Date  . CARDIAC CATHETERIZATION  2005   [facility]-report in [place] in epic  . CERVICAL FUSION  2006  . COLONOSCOPY    . COLONOSCOPY N/A 08/19/2014   Procedure: COLONOSCOPY;  Surgeon: Rogene Houston, MD;  [facility];  Service: Endoscopy;  Laterality: N/A;  830  . FINGER ARTHROPLASTY  9/13   lt long and index  . HEMORRHOID SURGERY    . INTRAMEDULLARY (IM) NAIL INTERTROCHANTERIC Left 06/14/2015   Procedure: INTRAMEDULLARY (IM) NAIL INTERTROCHANTRIC;  Surgeon: Newt Minion, MD;  Location: [facility];  Service: Orthopedics;  Laterality: Left;  . OPEN REDUCTION INTERNAL FIXATION (ORIF) DISTAL RADIAL FRACTURE Right 06/25/2014   Procedure: OPEN REDUCTION INTERNAL FIXATION (ORIF) RIGHT  DISTAL RADIUS FRACTURE;  Surgeon: Leanora Cover, MD;  Location: [facility];  Service: Orthopedics;  Laterality: Right;  . TENOLYSIS Left  05/02/2012   Procedure: LEFT LONG TENOLYSIS CAPSULE RELEASES MANIPULATION OF IP/MP JOINTS;  Surgeon: Cammie Sickle., MD;  Location: [facility];  Service: Orthopedics;  Laterality: Left;  . TUBAL LIGATION     Social History   Occupational History  . Not on file  Tobacco Use  . Smoking status: Former Smoker    Last attempt to quit: 10/03/1986    Years since quitting: 30.1  . Smokeless tobacco: Never Used  Substance and Sexual Activity  . Alcohol use: No  . Drug use: No  . Sexual activity: Not on file

## 2016-12-07 ENCOUNTER — Ambulatory Visit (INDEPENDENT_AMBULATORY_CARE_PROVIDER_SITE_OTHER): Payer: Medicare Other | Admitting: Family

## 2017-06-15 IMAGING — RF DG HIP (WITH PELVIS) OPERATIVE*L*
1 series · 2 of 2 positions shown · non-contrast
Comparison: X-ray from earlier today

CLINICAL DATA: Intra medullary nail placement across left hip
fracture.

FLUOROSCOPY TIME:  21 seconds.
Images: 2.
EXAM:
OPERATIVE LEFT HIP (WITH PELVIS IF PERFORMED) 2 VIEWS
TECHNIQUE: Fluoroscopic spot image(s) were submitted for interpretation
post-operatively.

[Series 1: run · 2 of 2 slices shown]
[im 1/2]
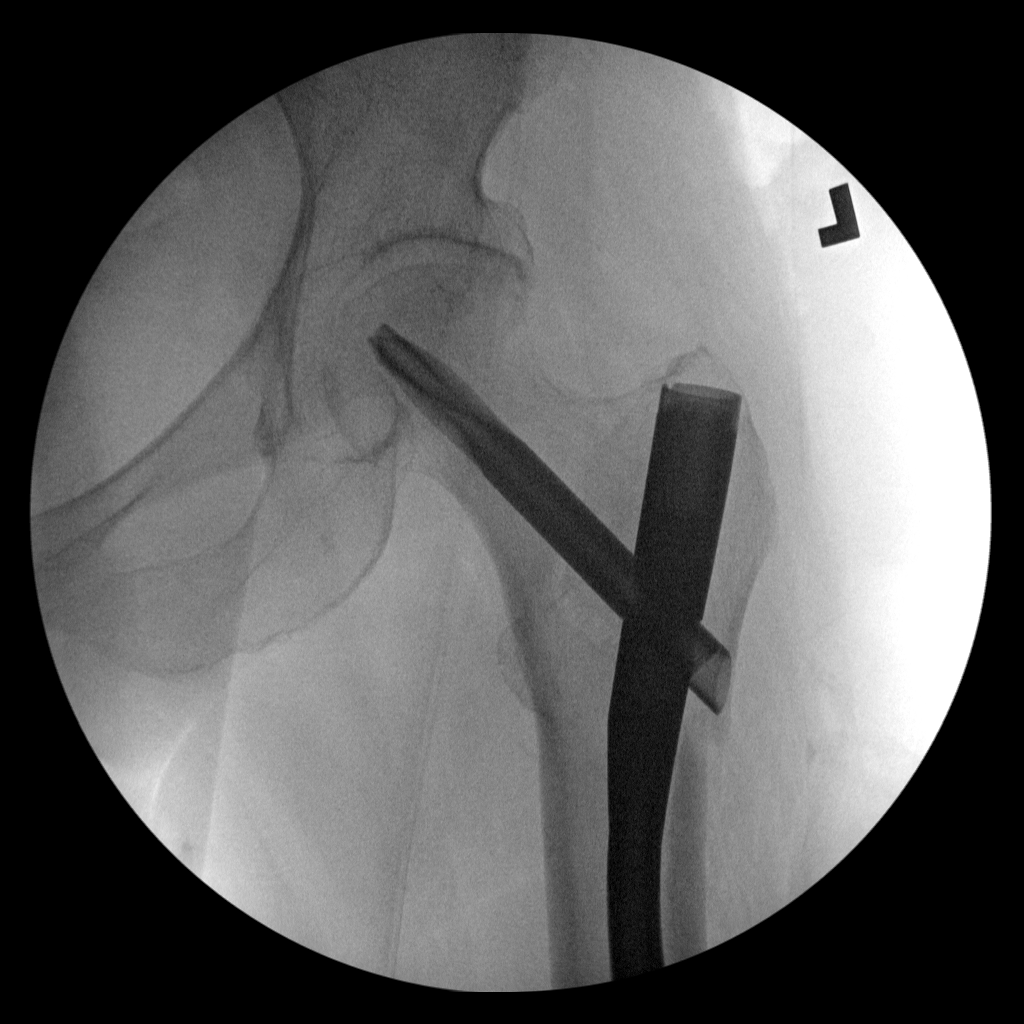
[im 2/2]
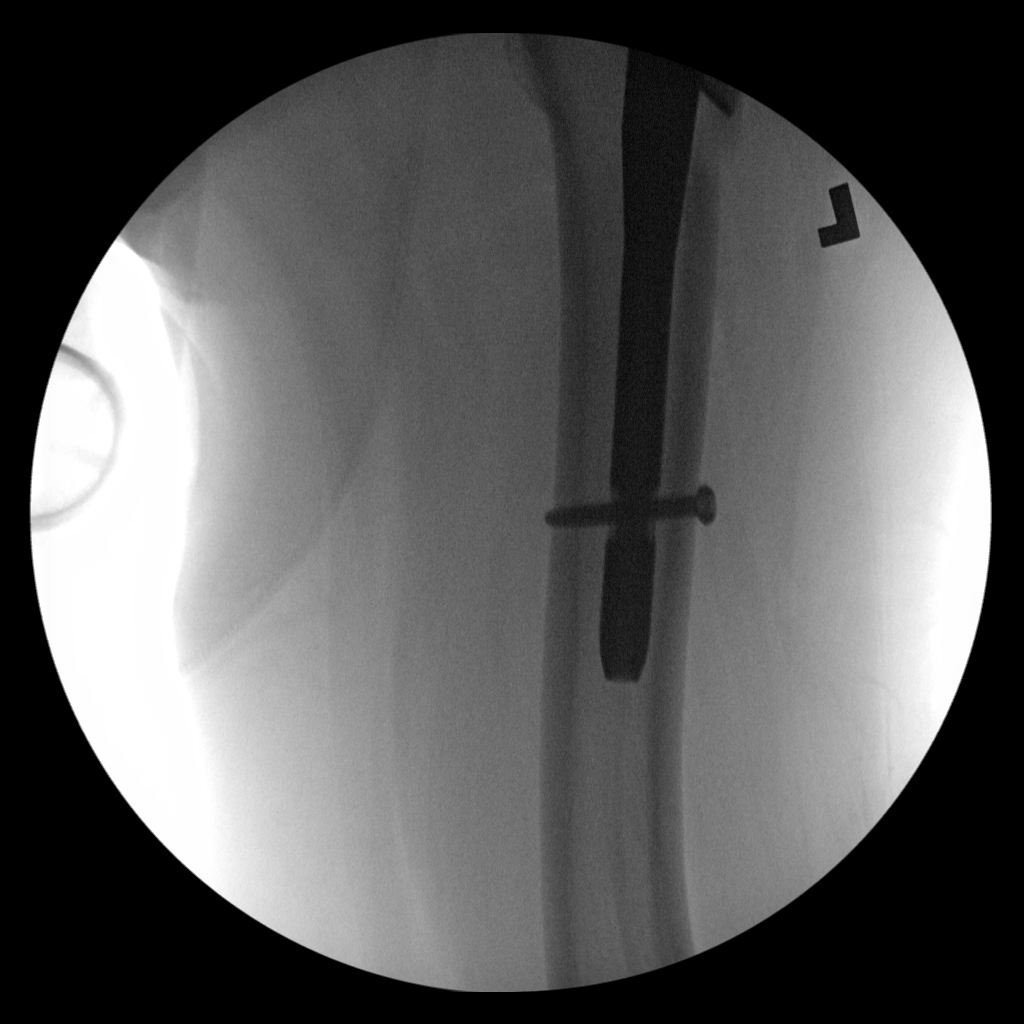

[2 of 2 positions shown; findings below may reference images not displayed]

FINDINGS: An intra medullary nail has been placed across the left
intertrochanteric fracture. Hardware is in good position. Alignment
is near anatomic on provided images.
IMPRESSION: Appropriate intra medullary nail placement.

## 2017-07-27 DIAGNOSIS — Z87891 Personal history of nicotine dependence: Secondary | ICD-10-CM | POA: Diagnosis not present

## 2017-07-27 DIAGNOSIS — J321 Chronic frontal sinusitis: Secondary | ICD-10-CM | POA: Diagnosis not present

## 2017-07-27 DIAGNOSIS — J0100 Acute maxillary sinusitis, unspecified: Secondary | ICD-10-CM | POA: Diagnosis not present

## 2017-07-27 DIAGNOSIS — Z6829 Body mass index (BMI) 29.0-29.9, adult: Secondary | ICD-10-CM | POA: Diagnosis not present

## 2017-07-27 DIAGNOSIS — Z299 Encounter for prophylactic measures, unspecified: Secondary | ICD-10-CM | POA: Diagnosis not present

## 2017-09-13 DIAGNOSIS — M79605 Pain in left leg: Secondary | ICD-10-CM | POA: Diagnosis not present

## 2017-09-13 DIAGNOSIS — Z96642 Presence of left artificial hip joint: Secondary | ICD-10-CM | POA: Diagnosis not present

## 2017-09-13 DIAGNOSIS — M79662 Pain in left lower leg: Secondary | ICD-10-CM | POA: Diagnosis not present

## 2017-09-13 DIAGNOSIS — M791 Myalgia, unspecified site: Secondary | ICD-10-CM | POA: Diagnosis not present

## 2017-09-13 DIAGNOSIS — Z299 Encounter for prophylactic measures, unspecified: Secondary | ICD-10-CM | POA: Diagnosis not present

## 2017-09-13 DIAGNOSIS — Z6828 Body mass index (BMI) 28.0-28.9, adult: Secondary | ICD-10-CM | POA: Diagnosis not present

## 2017-09-13 DIAGNOSIS — M25552 Pain in left hip: Secondary | ICD-10-CM | POA: Diagnosis not present

## 2017-09-13 DIAGNOSIS — Z713 Dietary counseling and surveillance: Secondary | ICD-10-CM | POA: Diagnosis not present

## 2017-09-24 ENCOUNTER — Encounter (INDEPENDENT_AMBULATORY_CARE_PROVIDER_SITE_OTHER): Payer: Self-pay | Admitting: Orthopedic Surgery

## 2017-09-24 ENCOUNTER — Ambulatory Visit (INDEPENDENT_AMBULATORY_CARE_PROVIDER_SITE_OTHER): Payer: Medicare Other | Admitting: Physician Assistant

## 2017-09-24 VITALS — Ht 62.0 in | Wt 136.0 lb

## 2017-09-24 DIAGNOSIS — M5442 Lumbago with sciatica, left side: Secondary | ICD-10-CM | POA: Diagnosis not present

## 2017-09-24 MED ORDER — PREDNISONE 10 MG PO TABS: 20.0000 mg | ORAL_TABLET | Freq: Every day | ORAL | 0 refills | Status: DC

## 2017-09-24 NOTE — Progress Notes (Signed)
Office Visit Note   Patient: [name]           Date of Birth: February 17, 1945           MRN: [id number] Visit Date: 09/24/2017              Requested by: [name], MD [address] [place], [place] [address] PCP: [name], MD  Chief Complaint  Patient presents with  . Left Hip - Pain      HPI: Patient is a 72 year old female who comes in today with complaints of left lower extremity pain.  She reports that she woke up one morning about 2 weeks ago and noticed some pain in her posterior left calf and that her medial toes felt somewhat numb.  She noticed she felt achy throughout her left buttock thigh and the left calf.  She reports that she saw her primary care physician who checked Doppler studies of the lower extremity and DVT was ruled out.  She also had radiographs of the left hip which showed her left femur hardware from her previous fracture in place without periprosthetic lucency.  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  The patient reports that the pain was worse with sitting but improved with walking.  She reports that she had the pain at nighttime and was not sleeping well due to the pain.  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  She reports that pain is achy and severe at times.  She is not specifically having any back pain but does have pain in the buttock thigh and left calf toes which feel numb at times.  She has been trying Tylenol and ibuprofen for pain without relief.  She is not diabetic.  Assessment & Plan: Visit Diagnoses:  1. Acute left-sided low back pain with left-sided sciatica     Plan: Going to start the patient on prednisone 20 mg daily with breakfast.  She will try this for at least a week and then cut back to 10 g daily at breakfast for an additional week.  We discussed increasing or decreasing the medication according to the patient's  symptoms.  She will follow-up here in 4 weeks or sooner should she have further difficulties in the interim.  Follow-Up Instructions: Return in about 4 weeks (around 10/22/2017).   Ortho Exam  Patient is alert, oriented, no adenopathy, well-dressed, normal affect, normal respiratory effort. Patient's left hip is nontender to palpation as is the left lower extremity.  Left hip internal rotation is 10 degrees external rotation is 30 degrees forward flexion is full extension is full but straight leg raising does elicit the patient's symptoms down her left lower extremity.  She is slightly weak on the left EHL when compared with the right.  Her gait is normal and nonantalgic.  She is not walking with an assistive device.  Imaging: No results found. No images are attached to the encounter.  Labs: Lab Results  Component Value Date   HGBA1C 5.3 06/15/2015     No results found for: ALBUMIN, PREALBUMIN, LABURIC  Body mass index is 24.87 kg/m.  Orders:  No orders of the defined types were placed in this encounter.  Meds ordered this encounter  Medications  . predniSONE (DELTASONE) 10 MG tablet    Sig: Take 2 tablets (20 mg total) by mouth  daily with breakfast.    Dispense:  60 tablet    Refill:  0     Procedures: No procedures performed  Clinical Data: No additional findings.  ROS:  All other systems negative, except as noted in the HPI. Review of Systems  Objective: Vital Signs: Ht 5\' 2"  (1.575 m)   Wt 136 lb (61.7 kg)   BMI 24.87 kg/m   Specialty Comments:  No specialty comments available.  PMFS History: Patient Active Problem List   Diagnosis Date Noted  . Closed left hip fracture ([facility]) 07/02/2015  . Anemia, unspecified 07/02/2015   Past Medical History:  Diagnosis Date  . Distal radius fracture, right    mechanical fall  . Hip fracture, left ([place])    2017  . Hyperglycemia   . Kidney stone    X 2  . UTI (lower urinary tract infection)    06/2015      Family History  Problem Relation Age of Onset  . Cancer Father        cns  . Diabetes Neg Hx   . Heart disease Neg Hx   . Stroke Neg Hx     Past Surgical History:  Procedure Laterality Date  . CARDIAC CATHETERIZATION  2005   [facility] in [place] in epic  . CERVICAL FUSION  2006  . COLONOSCOPY    . COLONOSCOPY N/A 08/19/2014   Procedure: COLONOSCOPY;  Surgeon: Rogene Houston, MD;  [facility];  Service: Endoscopy;  Laterality: N/A;  830  . FINGER ARTHROPLASTY  9/13   lt long and index  . HEMORRHOID SURGERY    . INTRAMEDULLARY (IM) NAIL INTERTROCHANTERIC Left 06/14/2015   Procedure: INTRAMEDULLARY (IM) NAIL INTERTROCHANTRIC;  Surgeon: Newt Minion, MD;  Location: [facility];  Service: Orthopedics;  Laterality: Left;  . OPEN REDUCTION INTERNAL FIXATION (ORIF) DISTAL RADIAL FRACTURE Right 06/25/2014   Procedure: OPEN REDUCTION INTERNAL FIXATION (ORIF) RIGHT  DISTAL RADIUS FRACTURE;  Surgeon: Leanora Cover, MD;  Location: [facility];  Service: Orthopedics;  Laterality: Right;  . TENOLYSIS Left 05/02/2012   Procedure: LEFT LONG TENOLYSIS CAPSULE RELEASES MANIPULATION OF IP/MP JOINTS;  Surgeon: Cammie Sickle., MD;  Location: [facility];  Service: Orthopedics;  Laterality: Left;  . TUBAL LIGATION     Social History   Occupational History  . Not on file  Tobacco Use  . Smoking status: Former Smoker    Last attempt to quit: 10/03/1986    Years since quitting: 30.9  . Smokeless tobacco: Never Used  Substance and Sexual Activity  . Alcohol use: No  . Drug use: No  . Sexual activity: Not on file

## 2017-10-14 ENCOUNTER — Other Ambulatory Visit (INDEPENDENT_AMBULATORY_CARE_PROVIDER_SITE_OTHER): Payer: Self-pay | Admitting: Physician Assistant

## 2017-10-15 DIAGNOSIS — Z23 Encounter for immunization: Secondary | ICD-10-CM | POA: Diagnosis not present

## 2017-10-15 NOTE — Telephone Encounter (Signed)
Do you want to refill? 

## 2017-10-22 ENCOUNTER — Encounter (INDEPENDENT_AMBULATORY_CARE_PROVIDER_SITE_OTHER): Payer: Self-pay | Admitting: Orthopedic Surgery

## 2017-10-22 ENCOUNTER — Ambulatory Visit (INDEPENDENT_AMBULATORY_CARE_PROVIDER_SITE_OTHER): Payer: Medicare Other | Admitting: Orthopedic Surgery

## 2017-10-22 VITALS — Ht 62.0 in | Wt 136.0 lb

## 2017-10-22 DIAGNOSIS — M5410 Radiculopathy, site unspecified: Secondary | ICD-10-CM | POA: Diagnosis not present

## 2017-10-22 DIAGNOSIS — M5442 Lumbago with sciatica, left side: Secondary | ICD-10-CM | POA: Diagnosis not present

## 2017-10-23 ENCOUNTER — Encounter (INDEPENDENT_AMBULATORY_CARE_PROVIDER_SITE_OTHER): Payer: Self-pay | Admitting: Orthopedic Surgery

## 2017-10-23 NOTE — Progress Notes (Signed)
Office Visit Note   Patient: [name]           Date of Birth: [birth year]           MRN: [id number] Visit Date: 10/22/2017              Requested by: [name], MD [address] [place], [place] [address] PCP: [name], MD  Chief Complaint  Patient presents with  . Left Leg - Follow-up  . Left Hip - Follow-up      HPI: Patient is a 72 year old woman with left-sided radicular symptoms.  She states that her toes are still numb left great toe and second toe.  She states she occasionally has some right sided numbness as well.  Patient states the prednisone provided her a little relief but she is still symptomatic.  Patient denies any weakness.  Assessment & Plan: Visit Diagnoses:  1. Radicular pain of left lower extremity   2. Acute left-sided low back pain with left-sided sciatica     Plan: We will plan to obtain an MRI scan of the lumbar spine she may benefit from an epidural steroid injection.  Follow-Up Instructions: Return in about 2 weeks (around 11/05/2017).   Ortho Exam  Patient is alert, oriented, no adenopathy, well-dressed, normal affect, normal respiratory effort. Examination patient has negative straight leg raise bilaterally she has good motor strength in all motor groups of both lower extremities with no focal motor weakness.  Imaging: No results found. No images are attached to the encounter.  Labs: Lab Results  Component Value Date   HGBA1C 5.3 06/15/2015     No results found for: ALBUMIN, PREALBUMIN, LABURIC  Body mass index is 24.87 kg/m.  Orders:  Orders Placed This Encounter  Procedures  . MR Lumbar Spine w/o contrast   No orders of the defined types were placed in this encounter.    Procedures: No procedures performed  Clinical Data: No additional findings.  ROS:  All other systems negative, except as noted in the HPI. Review of Systems  Objective: Vital Signs: Ht 5\' 2"  (1.575 m)   Wt 136 lb (61.7 kg)   BMI 24.87  kg/m   Specialty Comments:  No specialty comments available.  PMFS History: Patient Active Problem List   Diagnosis Date Noted  . Closed left hip fracture ([place]) 07/02/2015  . Anemia, unspecified 07/02/2015   Past Medical History:  Diagnosis Date  . Distal radius fracture, right    mechanical fall  . Hip fracture, left ([place])    2017  . Hyperglycemia   . Kidney stone    X 2  . UTI (lower urinary tract infection)    06/2015    Family History  Problem Relation Age of Onset  . Cancer Father        cns  . Diabetes Neg Hx   . Heart disease Neg Hx   . Stroke Neg Hx     Past Surgical History:  Procedure Laterality Date  . CARDIAC CATHETERIZATION  2005   [facility] in [place] in epic  . CERVICAL FUSION  2006  . COLONOSCOPY    . COLONOSCOPY N/A 08/19/2014   Procedure: COLONOSCOPY;  Surgeon: Rogene Houston, MD;  [facility];  Service: Endoscopy;  Laterality: N/A;  830  . FINGER ARTHROPLASTY  9/13   lt long and index  . HEMORRHOID SURGERY    . INTRAMEDULLARY (IM) NAIL INTERTROCHANTERIC Left 06/14/2015   Procedure: INTRAMEDULLARY (IM) NAIL  INTERTROCHANTRIC;  Surgeon: Newt Minion, MD;  Location: [facility];  Service: Orthopedics;  Laterality: Left;  . OPEN REDUCTION INTERNAL FIXATION (ORIF) DISTAL RADIAL FRACTURE Right 06/25/2014   Procedure: OPEN REDUCTION INTERNAL FIXATION (ORIF) RIGHT  DISTAL RADIUS FRACTURE;  Surgeon: Leanora Cover, MD;  Location: [facility];  Service: Orthopedics;  Laterality: Right;  . TENOLYSIS Left 05/02/2012   Procedure: LEFT LONG TENOLYSIS CAPSULE RELEASES MANIPULATION OF IP/MP JOINTS;  Surgeon: Cammie Sickle., MD;  Location: [facility];  Service: Orthopedics;  Laterality: Left;  . TUBAL LIGATION     Social History   Occupational History  . Not on file  Tobacco Use  . Smoking status: Former Smoker    Last attempt to quit: 10/03/1986    Years since quitting: 31.0  . Smokeless tobacco: Never Used    Substance and Sexual Activity  . Alcohol use: No  . Drug use: No  . Sexual activity: Not on file

## 2017-11-13 ENCOUNTER — Telehealth (INDEPENDENT_AMBULATORY_CARE_PROVIDER_SITE_OTHER): Payer: Self-pay | Admitting: Orthopedic Surgery

## 2017-11-13 NOTE — Telephone Encounter (Signed)
Can you please let pt know what she needs to do? She states that she has not heard back about setting up MRI

## 2017-11-13 NOTE — Telephone Encounter (Signed)
IC # listed and it kept ringing busy I called twice already, will try again later.

## 2017-11-13 NOTE — Telephone Encounter (Signed)
Patient called asking about her MRI stating it has been 3 weeks and she has not received a call to schedule it yet.  Patients # [phone]

## 2017-11-14 NOTE — Telephone Encounter (Signed)
#   keeps ringing as if off hook.

## 2017-11-15 ENCOUNTER — Encounter (INDEPENDENT_AMBULATORY_CARE_PROVIDER_SITE_OTHER): Payer: Self-pay | Admitting: *Deleted

## 2017-11-15 NOTE — Telephone Encounter (Signed)
Sent letter to pt to contact [facility].

## 2017-11-25 ENCOUNTER — Ambulatory Visit
Admission: RE | Admit: 2017-11-25 | Discharge: 2017-11-25 | Disposition: A | Discharge status: Home / Self Care | Payer: Medicare Other | Source: Ambulatory Visit | Attending: Orthopedic Surgery | Admitting: Orthopedic Surgery

## 2017-11-25 DIAGNOSIS — M48061 Spinal stenosis, lumbar region without neurogenic claudication: Secondary | ICD-10-CM | POA: Diagnosis not present

## 2017-11-25 DIAGNOSIS — M5127 Other intervertebral disc displacement, lumbosacral region: Secondary | ICD-10-CM | POA: Diagnosis not present

## 2017-11-25 DIAGNOSIS — M4807 Spinal stenosis, lumbosacral region: Secondary | ICD-10-CM | POA: Diagnosis not present

## 2017-11-25 DIAGNOSIS — M5126 Other intervertebral disc displacement, lumbar region: Secondary | ICD-10-CM | POA: Diagnosis not present

## 2017-11-25 DIAGNOSIS — M5410 Radiculopathy, site unspecified: Secondary | ICD-10-CM

## 2017-11-27 ENCOUNTER — Telehealth (INDEPENDENT_AMBULATORY_CARE_PROVIDER_SITE_OTHER): Payer: Self-pay

## 2017-11-27 ENCOUNTER — Other Ambulatory Visit (INDEPENDENT_AMBULATORY_CARE_PROVIDER_SITE_OTHER): Payer: Self-pay

## 2017-11-27 DIAGNOSIS — M5410 Radiculopathy, site unspecified: Secondary | ICD-10-CM

## 2017-11-27 NOTE — Telephone Encounter (Signed)
Called pt to advise of MRI results per Dr. Sharol Given. She would like to proceed with a referral to Dr. Ernestina Patches for consideration of ESI. Referral made and advised pt she should hear back within the next few days

## 2017-11-27 NOTE — Telephone Encounter (Signed)
-----   Message from Newt Minion, MD sent at 11/26/2017 10:38 AM EST ----- Call patient and lets set her up for follow-up with Dr. Ernestina Patches to evaluate for epidural steroid injection.

## 2017-12-03 ENCOUNTER — Ambulatory Visit (INDEPENDENT_AMBULATORY_CARE_PROVIDER_SITE_OTHER): Payer: Medicare Other | Admitting: Orthopedic Surgery

## 2017-12-17 ENCOUNTER — Ambulatory Visit (INDEPENDENT_AMBULATORY_CARE_PROVIDER_SITE_OTHER): Payer: Medicare Other | Admitting: Physical Medicine and Rehabilitation

## 2017-12-17 ENCOUNTER — Encounter (INDEPENDENT_AMBULATORY_CARE_PROVIDER_SITE_OTHER): Payer: Self-pay | Admitting: Physical Medicine and Rehabilitation

## 2017-12-17 ENCOUNTER — Ambulatory Visit (INDEPENDENT_AMBULATORY_CARE_PROVIDER_SITE_OTHER): Payer: Self-pay

## 2017-12-17 VITALS — BP 138/79 | HR 68 | Temp 97.9°F

## 2017-12-17 DIAGNOSIS — M5416 Radiculopathy, lumbar region: Secondary | ICD-10-CM

## 2017-12-17 MED ORDER — METHYLPREDNISOLONE ACETATE 80 MG/ML IJ SUSP
80.0000 mg | Freq: Once | INTRAMUSCULAR | Status: AC
  Administered 2017-12-17: 80 mg

## 2017-12-17 NOTE — Patient Instructions (Signed)

## 2017-12-17 NOTE — Progress Notes (Signed)
 .  Numeric Pain Rating Scale and Functional Assessment Average Pain 8   In the last MONTH (on 0-10 scale) has pain interfered with the following?  1. General activity like being  able to carry out your everyday physical activities such as walking, climbing stairs, carrying groceries, or moving a chair?  Rating(6)   +Driver, -BT, -Dye Allergies.  

## 2018-01-25 NOTE — Procedures (Signed)
Lumbosacral Transforaminal Epidural Steroid Injection - Sub-Pedicular Approach with Fluoroscopic Guidance  Patient: [name]      Date of Birth: [birth year] MRN: [id number] PCP: [name], MD      Visit Date: 12/17/2017   Universal Protocol:    Date/Time: 12/17/2017  Consent Given By: the patient  Position: PRONE  Additional Comments: Vital signs were monitored before and after the procedure. Patient was prepped and draped in the usual sterile fashion. The correct patient, procedure, and site was verified.   Injection Procedure Details:  Procedure Site One Meds Administered:  Meds ordered this encounter  Medications  . methylPREDNISolone acetate (DEPO-MEDROL) injection 80 mg    Laterality: Left  Location/Site:  L5-S1  Needle size: 22 G  Needle type: Spinal  Needle Placement: Transforaminal  Findings:    -Comments: Excellent flow of contrast along the nerve and into the epidural space.  Procedure Details: After squaring off the end-plates to get a true AP view, the C-arm was positioned so that an oblique view of the foramen as noted above was visualized. The target area is just inferior to the "nose of the scotty dog" or sub pedicular. The soft tissues overlying this structure were infiltrated with 2-3 ml. of 1% Lidocaine without Epinephrine.  The spinal needle was inserted toward the target using a "trajectory" view along the fluoroscope beam.  Under AP and lateral visualization, the needle was advanced so it did not puncture dura and was located close the 6 O'Clock position of the pedical in AP tracterory. Biplanar projections were used to confirm position. Aspiration was confirmed to be negative for CSF and/or blood. A 1-2 ml. volume of Isovue-250 was injected and flow of contrast was noted at each level. Radiographs were obtained for documentation purposes.   After attaining the desired flow of contrast documented above, a 0.5 to 1.0 ml test dose of 0.25%  Marcaine was injected into each respective transforaminal space.  The patient was observed for 90 seconds post injection.  After no sensory deficits were reported, and normal lower extremity motor function was noted,   the above injectate was administered so that equal amounts of the injectate were placed at each foramen (level) into the transforaminal epidural space.   Additional Comments:  The patient tolerated the procedure well Dressing: Band-Aid    Post-procedure details: Patient was observed during the procedure. Post-procedure instructions were reviewed.  Patient left the clinic in stable condition.

## 2018-01-25 NOTE — Progress Notes (Signed)
[name] - 73 y.o. female MRN [id number]  Date of birth: Dec 30, 1945  Office Visit Note: Visit Date: 12/17/2017 PCP: [name], MD Referred by: [name], MD  Subjective: Chief Complaint  Patient presents with  . Lower Back - Pain  . Left Thigh - Pain   HPI:  [name] is a 73 y.o. female who comes in today MRI review and evaluation management of low back pain left more than right hip and leg pain.  MRI shows mainly brought her disc protrusion at L4-5 with some arthritic changes causing some lateral recess narrowing really more right than left but bilateral.  Small central and foraminal protrusion on the right at L5-S1.  Symptoms are more L5 distribution on the left.  And completed diagnostic hopefully therapeutic left L5 transforaminal epidural steroid injection.  Depending on outcome would look at L4 injection.  Has failed conservative care otherwise.  ROS Otherwise per HPI.  Assessment & Plan: Visit Diagnoses:  1. Lumbar radiculopathy     Plan: No additional findings.   Meds & Orders:  Meds ordered this encounter  Medications  . methylPREDNISolone acetate (DEPO-MEDROL) injection 80 mg    Orders Placed This Encounter  Procedures  . XR C-ARM NO REPORT  . Epidural Steroid injection    Follow-up: Return if symptoms worsen or fail to improve.   Procedures: No procedures performed  Lumbosacral Transforaminal Epidural Steroid Injection - Sub-Pedicular Approach with Fluoroscopic Guidance  Patient: DELENA Sanchez      Date of Birth: [birth year] MRN: [id number] PCP: [name], MD      Visit Date: 12/17/2017   Universal Protocol:    Date/Time: 12/17/2017  Consent Given By: the patient  Position: PRONE  Additional Comments: Vital signs were monitored before and after the procedure. Patient was prepped and draped in the usual sterile fashion. The correct patient, procedure, and site was verified.   Injection Procedure Details:  Procedure Site  One Meds Administered:  Meds ordered this encounter  Medications  . methylPREDNISolone acetate (DEPO-MEDROL) injection 80 mg    Laterality: Left  Location/Site:  L5-S1  Needle size: 22 G  Needle type: Spinal  Needle Placement: Transforaminal  Findings:    -Comments: Excellent flow of contrast along the nerve and into the epidural space.  Procedure Details: After squaring off the end-plates to get a true AP view, the C-arm was positioned so that an oblique view of the foramen as noted above was visualized. The target area is just inferior to the "nose of the scotty dog" or sub pedicular. The soft tissues overlying this structure were infiltrated with 2-3 ml. of 1% Lidocaine without Epinephrine.  The spinal needle was inserted toward the target using a "trajectory" view along the fluoroscope beam.  Under AP and lateral visualization, the needle was advanced so it did not puncture dura and was located close the 6 O'Clock position of the pedical in AP tracterory. Biplanar projections were used to confirm position. Aspiration was confirmed to be negative for CSF and/or blood. A 1-2 ml. volume of Isovue-250 was injected and flow of contrast was noted at each level. Radiographs were obtained for documentation purposes.   After attaining the desired flow of contrast documented above, a 0.5 to 1.0 ml test dose of 0.25% Marcaine was injected into each respective transforaminal space.  The patient was observed for 90 seconds post injection.  After no sensory deficits were reported, and normal lower extremity motor function was noted,  the above injectate was administered so that equal amounts of the injectate were placed at each foramen (level) into the transforaminal epidural space.   Additional Comments:  The patient tolerated the procedure well Dressing: Band-Aid    Post-procedure details: Patient was observed during the procedure. Post-procedure instructions were reviewed.  Patient  left the clinic in stable condition.    Clinical History: MRI LUMBAR SPINE WITHOUT CONTRAST  TECHNIQUE: Multiplanar, multisequence MR imaging of the lumbar spine was performed. No intravenous contrast was administered.  COMPARISON:  None.  FINDINGS: Segmentation:  Standard based on the available coverage.  Alignment:  Physiologic.  Vertebrae:  No fracture, evidence of discitis, or bone lesion.  Conus medullaris and cauda equina: Conus extends to the T12-L1 level. Conus and cauda equina appear normal.  Paraspinal and other soft tissues: Fatty atrophy of intrinsic back muscles.  Disc levels:  T11-12: Central disc protrusion with buttressing osteophyte seen since at least 2006 thoracic MRI. No associated neural compression.  T12- L1: Unremarkable.  L1-L2: Narrow disc with bulge and tiny central protrusion. No impingement  L2-L3: Narrow disc with bulge and facet hypertrophy. Mild, noncompressive spinal stenosis.  L3-L4: Disc narrowing and bulging with asymmetric right subarticular recess narrowing that could affect the right L4 nerve root. Noncompressive spinal stenosis  L4-L5: Disc narrowing and bulging with posterior element hypertrophy. There is a superimposed downward pointing central disc protrusion that is small. Right more than left subarticular recess narrowing that could affect either L5 nerve root.  L5-S1:Disc narrowing with a shallow central protrusion. There is a right inferior foraminal protrusion without neural contact on sagittal images. No compressive stenosis.  IMPRESSION: 1. Generalized disc narrowing and bulge with mild posterior element hypertrophy. 2. Mild spinal stenosis from L2-3 to L4-5. 3. Subarticular recess narrowing that could affect the right L4 and either L5 nerve roots.   Electronically Signed   By: Monte Fantasia M.D.   On: 11/26/2017 08:21     Objective:  VS:  HT:    WT:   BMI:     BP:138/79  HR:68bpm   TEMP:97.9 F (36.6 C)(Oral)  RESP:  Physical Exam  Ortho Exam Imaging: No results found.

## 2018-02-18 DIAGNOSIS — N390 Urinary tract infection, site not specified: Secondary | ICD-10-CM | POA: Diagnosis not present

## 2018-02-18 DIAGNOSIS — R350 Frequency of micturition: Secondary | ICD-10-CM | POA: Diagnosis not present

## 2018-02-18 DIAGNOSIS — Z6830 Body mass index (BMI) 30.0-30.9, adult: Secondary | ICD-10-CM | POA: Diagnosis not present

## 2018-02-18 DIAGNOSIS — Z299 Encounter for prophylactic measures, unspecified: Secondary | ICD-10-CM | POA: Diagnosis not present

## 2018-02-18 DIAGNOSIS — Z713 Dietary counseling and surveillance: Secondary | ICD-10-CM | POA: Diagnosis not present

## 2018-03-13 DIAGNOSIS — J320 Chronic maxillary sinusitis: Secondary | ICD-10-CM | POA: Diagnosis not present

## 2018-03-13 DIAGNOSIS — Z299 Encounter for prophylactic measures, unspecified: Secondary | ICD-10-CM | POA: Diagnosis not present

## 2018-03-13 DIAGNOSIS — Z789 Other specified health status: Secondary | ICD-10-CM | POA: Diagnosis not present

## 2018-03-13 DIAGNOSIS — Z6830 Body mass index (BMI) 30.0-30.9, adult: Secondary | ICD-10-CM | POA: Diagnosis not present

## 2018-05-14 DIAGNOSIS — Z6830 Body mass index (BMI) 30.0-30.9, adult: Secondary | ICD-10-CM | POA: Diagnosis not present

## 2018-05-14 DIAGNOSIS — E7800 Pure hypercholesterolemia, unspecified: Secondary | ICD-10-CM | POA: Diagnosis not present

## 2018-05-14 DIAGNOSIS — Z713 Dietary counseling and surveillance: Secondary | ICD-10-CM | POA: Diagnosis not present

## 2018-05-14 DIAGNOSIS — G44209 Tension-type headache, unspecified, not intractable: Secondary | ICD-10-CM | POA: Diagnosis not present

## 2018-05-14 DIAGNOSIS — Z299 Encounter for prophylactic measures, unspecified: Secondary | ICD-10-CM | POA: Diagnosis not present

## 2018-05-28 DIAGNOSIS — Z6830 Body mass index (BMI) 30.0-30.9, adult: Secondary | ICD-10-CM | POA: Diagnosis not present

## 2018-05-28 DIAGNOSIS — Z1331 Encounter for screening for depression: Secondary | ICD-10-CM | POA: Diagnosis not present

## 2018-05-28 DIAGNOSIS — Z1211 Encounter for screening for malignant neoplasm of colon: Secondary | ICD-10-CM | POA: Diagnosis not present

## 2018-05-28 DIAGNOSIS — E7800 Pure hypercholesterolemia, unspecified: Secondary | ICD-10-CM | POA: Diagnosis not present

## 2018-05-28 DIAGNOSIS — Z79899 Other long term (current) drug therapy: Secondary | ICD-10-CM | POA: Diagnosis not present

## 2018-05-28 DIAGNOSIS — Z0000 Encounter for general adult medical examination without abnormal findings: Secondary | ICD-10-CM | POA: Diagnosis not present

## 2018-05-28 DIAGNOSIS — Z299 Encounter for prophylactic measures, unspecified: Secondary | ICD-10-CM | POA: Diagnosis not present

## 2018-05-28 DIAGNOSIS — Z7189 Other specified counseling: Secondary | ICD-10-CM | POA: Diagnosis not present

## 2018-05-28 DIAGNOSIS — R5383 Other fatigue: Secondary | ICD-10-CM | POA: Diagnosis not present

## 2018-05-28 DIAGNOSIS — Z1339 Encounter for screening examination for other mental health and behavioral disorders: Secondary | ICD-10-CM | POA: Diagnosis not present

## 2018-06-20 DIAGNOSIS — Z6830 Body mass index (BMI) 30.0-30.9, adult: Secondary | ICD-10-CM | POA: Diagnosis not present

## 2018-06-20 DIAGNOSIS — N390 Urinary tract infection, site not specified: Secondary | ICD-10-CM | POA: Diagnosis not present

## 2018-06-20 DIAGNOSIS — Z299 Encounter for prophylactic measures, unspecified: Secondary | ICD-10-CM | POA: Diagnosis not present

## 2018-06-20 DIAGNOSIS — R350 Frequency of micturition: Secondary | ICD-10-CM | POA: Diagnosis not present

## 2018-06-20 DIAGNOSIS — R42 Dizziness and giddiness: Secondary | ICD-10-CM | POA: Diagnosis not present

## 2018-07-02 DIAGNOSIS — R350 Frequency of micturition: Secondary | ICD-10-CM | POA: Diagnosis not present

## 2018-07-02 DIAGNOSIS — Z6829 Body mass index (BMI) 29.0-29.9, adult: Secondary | ICD-10-CM | POA: Diagnosis not present

## 2018-07-02 DIAGNOSIS — Z299 Encounter for prophylactic measures, unspecified: Secondary | ICD-10-CM | POA: Diagnosis not present

## 2018-07-02 DIAGNOSIS — Z713 Dietary counseling and surveillance: Secondary | ICD-10-CM | POA: Diagnosis not present

## 2018-07-02 DIAGNOSIS — N390 Urinary tract infection, site not specified: Secondary | ICD-10-CM | POA: Diagnosis not present

## 2018-08-09 ENCOUNTER — Other Ambulatory Visit: Payer: Self-pay

## 2018-10-30 DIAGNOSIS — Z299 Encounter for prophylactic measures, unspecified: Secondary | ICD-10-CM | POA: Diagnosis not present

## 2018-10-30 DIAGNOSIS — H6090 Unspecified otitis externa, unspecified ear: Secondary | ICD-10-CM | POA: Diagnosis not present

## 2018-10-30 DIAGNOSIS — Z713 Dietary counseling and surveillance: Secondary | ICD-10-CM | POA: Diagnosis not present

## 2018-10-30 DIAGNOSIS — Z6829 Body mass index (BMI) 29.0-29.9, adult: Secondary | ICD-10-CM | POA: Diagnosis not present

## 2018-12-23 ENCOUNTER — Ambulatory Visit (INDEPENDENT_AMBULATORY_CARE_PROVIDER_SITE_OTHER): Payer: Medicare Other

## 2018-12-23 ENCOUNTER — Encounter: Payer: Self-pay | Admitting: Orthopedic Surgery

## 2018-12-23 ENCOUNTER — Ambulatory Visit (INDEPENDENT_AMBULATORY_CARE_PROVIDER_SITE_OTHER): Payer: Medicare Other | Admitting: Orthopedic Surgery

## 2018-12-23 ENCOUNTER — Other Ambulatory Visit: Payer: Self-pay

## 2018-12-23 VITALS — Ht 62.0 in | Wt 136.0 lb

## 2018-12-23 DIAGNOSIS — M25551 Pain in right hip: Secondary | ICD-10-CM | POA: Diagnosis not present

## 2018-12-23 MED ORDER — PREDNISONE 10 MG PO TABS: 20.0000 mg | ORAL_TABLET | Freq: Every day | ORAL | 1 refills | Status: DC

## 2018-12-23 MED ORDER — HYDROCODONE-ACETAMINOPHEN 5-325 MG PO TABS: 1.0000 | ORAL_TABLET | ORAL | 0 refills | Status: AC | PRN

## 2018-12-23 NOTE — Progress Notes (Signed)
Office Visit Note   Patient: [name]           Date of Birth: 31-Dec-1945           MRN: [id number] Visit Date: 12/23/2018              Requested by: [name], MD [address] [place],  [place] [address] PCP: [name], MD  Chief Complaint  Patient presents with  . Right Hip - Pain      HPI: This is a pleasant woman with a 3-week history of right groin pain it radiates down her leg and she reports some tingling in her foot.  This occurred after she was giving her mother about and helping her out of the tub she denies any weakness.  It makes it difficult for her to walk  Assessment & Plan: Visit Diagnoses:  1. Pain in right hip     Plan: She will begin prednisone 20 mg daily with food once she feels better she will decrease this amount to 10 mg I have also given her a very small amount of hydrocodone for her acute symptoms she will follow up in 3 weeks.  Based on her hip x-ray her started arthritis is more significant in her L4-5 S1 rather than her hip  Follow-Up Instructions: No follow-ups on file.   Ortho Exam  Patient is alert, oriented, no adenopathy, well-dressed, normal affect, normal respiratory effort. Right hip: She is tender in the groin I cannot reproduce more pain with internal and external rotation lower extremity sensation currently is intact motor strength intact  Imaging: No results found. No images are attached to the encounter.  Labs: Lab Results  Component Value Date   HGBA1C 5.3 06/15/2015     No results found for: ALBUMIN, PREALBUMIN, LABURIC  No results found for: MG No results found for: VD25OH  No results found for: PREALBUMIN CBC EXTENDED Latest Ref Rng & Units 06/19/2015 06/17/2015 06/16/2015  WBC 4.0 - 10.5 K/uL 7.2 6.9 8.4  RBC 3.87 - 5.11 MIL/uL 3.75(L) 3.10(L) 3.36(L)  HGB 12.0 - 15.0 g/dL 11.5(L) 9.2(L) 9.7(L)  HCT 36.0 - 46.0 % 33.7(L) 28.5(L) 30.2(L)  PLT 150 - 400 K/uL 258 155 167  NEUTROABS 1.7 - 7.7 K/uL 5.0 - -    LYMPHSABS 0.7 - 4.0 K/uL 1.2 - -     Body mass index is 24.87 kg/m.  Orders:  Orders Placed This Encounter  Procedures  . XR HIP UNILAT W OR W/O PELVIS 2-3 VIEWS RIGHT   Meds ordered this encounter  Medications  . HYDROcodone-acetaminophen (NORCO/VICODIN) 5-325 MG tablet    Sig: Take 1 tablet by mouth every 4 (four) hours as needed for moderate pain.    Dispense:  20 tablet    Refill:  0  . predniSONE (DELTASONE) 10 MG tablet    Sig: Take 2 tablets (20 mg total) by mouth daily with breakfast.    Dispense:  30 tablet    Refill:  1     Procedures: No procedures performed  Clinical Data: No additional findings.  ROS:  All other systems negative, except as noted in the HPI. Review of Systems  Objective: Vital Signs: Ht 5\' 2"  (1.575 m)   Wt 136 lb (61.7 kg)   BMI 24.87 kg/m   Specialty Comments:  No specialty comments available.  PMFS History: Patient Active Problem List   Diagnosis Date Noted  . Closed left hip fracture ([place]) 07/02/2015  . Anemia, unspecified  07/02/2015   Past Medical History:  Diagnosis Date  . Distal radius fracture, right    mechanical fall  . Hip fracture, left ([place])    2017  . Hyperglycemia   . Kidney stone    X 2  . UTI (lower urinary tract infection)    06/2015    Family History  Problem Relation Age of Onset  . Cancer Father        cns  . Diabetes Neg Hx   . Heart disease Neg Hx   . Stroke Neg Hx     Past Surgical History:  Procedure Laterality Date  . CARDIAC CATHETERIZATION  2005   [facility] in [place] in epic  . CERVICAL FUSION  2006  . COLONOSCOPY    . COLONOSCOPY N/A 08/19/2014   Procedure: COLONOSCOPY;  Surgeon: Rogene Houston, MD;  [facility];  Service: Endoscopy;  Laterality: N/A;  830  . FINGER ARTHROPLASTY  9/13   lt long and index  . HEMORRHOID SURGERY    . INTRAMEDULLARY (IM) NAIL INTERTROCHANTERIC Left 06/14/2015   Procedure: INTRAMEDULLARY (IM) NAIL INTERTROCHANTRIC;  Surgeon:  Newt Minion, MD;  Location: [facility];  Service: Orthopedics;  Laterality: Left;  . OPEN REDUCTION INTERNAL FIXATION (ORIF) DISTAL RADIAL FRACTURE Right 06/25/2014   Procedure: OPEN REDUCTION INTERNAL FIXATION (ORIF) RIGHT  DISTAL RADIUS FRACTURE;  Surgeon: Leanora Cover, MD;  Location: [facility];  Service: Orthopedics;  Laterality: Right;  . TENOLYSIS Left 05/02/2012   Procedure: LEFT LONG TENOLYSIS CAPSULE RELEASES MANIPULATION OF IP/MP JOINTS;  Surgeon: Cammie Sickle., MD;  Location: [facility];  Service: Orthopedics;  Laterality: Left;  . TUBAL LIGATION     Social History   Occupational History  . Not on file  Tobacco Use  . Smoking status: Former Smoker    Quit date: 10/03/1986    Years since quitting: 32.2  . Smokeless tobacco: Never Used  Substance and Sexual Activity  . Alcohol use: No  . Drug use: No  . Sexual activity: Not on file

## 2019-01-13 ENCOUNTER — Other Ambulatory Visit: Payer: Self-pay

## 2019-01-13 ENCOUNTER — Ambulatory Visit (INDEPENDENT_AMBULATORY_CARE_PROVIDER_SITE_OTHER): Payer: Medicare Other | Admitting: Orthopedic Surgery

## 2019-01-13 ENCOUNTER — Encounter: Payer: Self-pay | Admitting: Orthopedic Surgery

## 2019-01-13 DIAGNOSIS — M25551 Pain in right hip: Secondary | ICD-10-CM

## 2019-01-13 NOTE — Progress Notes (Signed)
Office Visit Note   Patient: [name]           Date of Birth: 01/17/1945           MRN: [id number] Visit Date: 01/13/2019              Requested by: [name], MD [address] [place],  [place] [address] PCP: [name], MD  Chief Complaint  Patient presents with  . Right Hip - Follow-up      HPI: This is a pleasant woman who I saw few weeks ago with right hip pain x-rays at that time demonstrated both lumbar and right hip degenerative changes.  She was placed on a course of steroids and is now completed this she reports she is doing much better  Assessment & Plan: Visit Diagnoses: No diagnosis found.  Plan: We discussed the importance of low impact activity and flexibility.  She may follow-up as needed or if her symptoms become painful again  Follow-Up Instructions: No follow-ups on file.   Ortho Exam  Patient is alert, oriented, no adenopathy, well-dressed, normal affect, normal respiratory effort. Right hip no pain with internal or external rotation she can fire all muscles of her lower extremity  Imaging: No results found. No images are attached to the encounter.  Labs: Lab Results  Component Value Date   HGBA1C 5.3 06/15/2015     No results found for: ALBUMIN, PREALBUMIN, LABURIC  No results found for: MG No results found for: VD25OH  No results found for: PREALBUMIN CBC EXTENDED Latest Ref Rng & Units 06/19/2015 06/17/2015 06/16/2015  WBC 4.0 - 10.5 K/uL 7.2 6.9 8.4  RBC 3.87 - 5.11 MIL/uL 3.75(L) 3.10(L) 3.36(L)  HGB 12.0 - 15.0 g/dL 11.5(L) 9.2(L) 9.7(L)  HCT 36.0 - 46.0 % 33.7(L) 28.5(L) 30.2(L)  PLT 150 - 400 K/uL 258 155 167  NEUTROABS 1.7 - 7.7 K/uL 5.0 - -  LYMPHSABS 0.7 - 4.0 K/uL 1.2 - -     There is no height or weight on file to calculate BMI.  Orders:  No orders of the defined types were placed in this encounter.  No orders of the defined types were placed in this encounter.    Procedures: No procedures performed  Clinical  Data: No additional findings.  ROS:  All other systems negative, except as noted in the HPI. Review of Systems  Objective: Vital Signs: There were no vitals taken for this visit.  Specialty Comments:  No specialty comments available.  PMFS History: Patient Active Problem List   Diagnosis Date Noted  . Closed left hip fracture ([place]) 07/02/2015  . Anemia, unspecified 07/02/2015   Past Medical History:  Diagnosis Date  . Distal radius fracture, right    mechanical fall  . Hip fracture, left ([place])    2017  . Hyperglycemia   . Kidney stone    X 2  . UTI (lower urinary tract infection)    06/2015    Family History  Problem Relation Age of Onset  . Cancer Father        cns  . Diabetes Neg Hx   . Heart disease Neg Hx   . Stroke Neg Hx     Past Surgical History:  Procedure Laterality Date  . CARDIAC CATHETERIZATION  2005   [facility] in [place] in epic  . CERVICAL FUSION  2006  . COLONOSCOPY    . COLONOSCOPY N/A 08/19/2014   Procedure: COLONOSCOPY;  Surgeon: Rogene Houston, MD;  [facility];  Service: Endoscopy;  Laterality: N/A;  830  . FINGER ARTHROPLASTY  9/13   lt long and index  . HEMORRHOID SURGERY    . INTRAMEDULLARY (IM) NAIL INTERTROCHANTERIC Left 06/14/2015   Procedure: INTRAMEDULLARY (IM) NAIL INTERTROCHANTRIC;  Surgeon: Newt Minion, MD;  Location: [facility];  Service: Orthopedics;  Laterality: Left;  . OPEN REDUCTION INTERNAL FIXATION (ORIF) DISTAL RADIAL FRACTURE Right 06/25/2014   Procedure: OPEN REDUCTION INTERNAL FIXATION (ORIF) RIGHT  DISTAL RADIUS FRACTURE;  Surgeon: Leanora Cover, MD;  Location: [facility];  Service: Orthopedics;  Laterality: Right;  . TENOLYSIS Left 05/02/2012   Procedure: LEFT LONG TENOLYSIS CAPSULE RELEASES MANIPULATION OF IP/MP JOINTS;  Surgeon: Cammie Sickle., MD;  Location: [facility];  Service: Orthopedics;  Laterality: Left;  . TUBAL LIGATION     Social History   Occupational  History  . Not on file  Tobacco Use  . Smoking status: Former Smoker    Quit date: 10/03/1986    Years since quitting: 32.3  . Smokeless tobacco: Never Used  Substance and Sexual Activity  . Alcohol use: No  . Drug use: No  . Sexual activity: Not on file

## 2019-06-03 DIAGNOSIS — Z1339 Encounter for screening examination for other mental health and behavioral disorders: Secondary | ICD-10-CM | POA: Diagnosis not present

## 2019-06-03 DIAGNOSIS — Z299 Encounter for prophylactic measures, unspecified: Secondary | ICD-10-CM | POA: Diagnosis not present

## 2019-06-03 DIAGNOSIS — Z1211 Encounter for screening for malignant neoplasm of colon: Secondary | ICD-10-CM | POA: Diagnosis not present

## 2019-06-03 DIAGNOSIS — Z7189 Other specified counseling: Secondary | ICD-10-CM | POA: Diagnosis not present

## 2019-06-03 DIAGNOSIS — Z0000 Encounter for general adult medical examination without abnormal findings: Secondary | ICD-10-CM | POA: Diagnosis not present

## 2019-06-03 DIAGNOSIS — Z6830 Body mass index (BMI) 30.0-30.9, adult: Secondary | ICD-10-CM | POA: Diagnosis not present

## 2019-06-03 DIAGNOSIS — E7800 Pure hypercholesterolemia, unspecified: Secondary | ICD-10-CM | POA: Diagnosis not present

## 2019-06-03 DIAGNOSIS — Z1331 Encounter for screening for depression: Secondary | ICD-10-CM | POA: Diagnosis not present

## 2019-06-03 DIAGNOSIS — E8940 Asymptomatic postprocedural ovarian failure: Secondary | ICD-10-CM | POA: Diagnosis not present

## 2019-06-03 DIAGNOSIS — R5383 Other fatigue: Secondary | ICD-10-CM | POA: Diagnosis not present

## 2019-06-10 DIAGNOSIS — Z79899 Other long term (current) drug therapy: Secondary | ICD-10-CM | POA: Diagnosis not present

## 2019-06-10 DIAGNOSIS — M859 Disorder of bone density and structure, unspecified: Secondary | ICD-10-CM | POA: Diagnosis not present

## 2019-06-10 DIAGNOSIS — E2839 Other primary ovarian failure: Secondary | ICD-10-CM | POA: Diagnosis not present

## 2019-08-28 DIAGNOSIS — Z1231 Encounter for screening mammogram for malignant neoplasm of breast: Secondary | ICD-10-CM | POA: Diagnosis not present

## 2019-11-05 ENCOUNTER — Other Ambulatory Visit: Payer: Medicare Other

## 2019-11-05 ENCOUNTER — Other Ambulatory Visit: Payer: Self-pay | Admitting: Critical Care Medicine

## 2019-11-05 DIAGNOSIS — Z20822 Contact with and (suspected) exposure to covid-19: Secondary | ICD-10-CM

## 2019-11-06 DIAGNOSIS — Z789 Other specified health status: Secondary | ICD-10-CM | POA: Diagnosis not present

## 2019-11-06 DIAGNOSIS — Z20822 Contact with and (suspected) exposure to covid-19: Secondary | ICD-10-CM | POA: Diagnosis not present

## 2019-11-06 DIAGNOSIS — Z299 Encounter for prophylactic measures, unspecified: Secondary | ICD-10-CM | POA: Diagnosis not present

## 2019-11-06 LAB — SARS-COV-2, NAA 2 DAY TAT

## 2019-11-06 LAB — NOVEL CORONAVIRUS, NAA: SARS-CoV-2, NAA: DETECTED — AB

## 2019-11-07 ENCOUNTER — Telehealth: Payer: Self-pay | Admitting: Physician Assistant

## 2019-11-07 NOTE — Telephone Encounter (Signed)
Called to discuss with [name] about Covid symptoms and the use of  monoclonal antibody infusion for those with mild to moderate Covid symptoms and at a high risk of hospitalization.     Pt is qualified for this infusion due to co-morbid conditions and/or a member of an at-risk group, however declines infusion at this time.  Symptoms reviewed as well as criteria for ending isolation.  Symptoms reviewed that would warrant ED/Hospital evaluation. Preventative practices reviewed. Patient verbalized understanding. Patient advised to call back if he decides that he does want to get infusion. Callback number to the infusion center given. Patient advised to go to Urgent care or ED with severe symptoms.   Onset of symptoms: 11/04/19 Risk factors: BMI > 25, unvaccinated for COVID, age, geographic location.  Leanor Kail, PA- C

## 2019-11-07 NOTE — Telephone Encounter (Signed)
Called to discuss with patient about Covid symptoms and the use of bamlanivimab/etesevimab or casirivimab/imdevimab, a monoclonal antibody infusion for those with mild to moderate Covid symptoms and at a high risk of hospitalization.  Pt is qualified for this infusion at the [facility] infusion center due to; Specific high risk criteria : Older age (>/= 73 yo) and Other high risk medical condition per CDC:  high SVI   Message left to call back our hotline [phone]. Also sent mychart message .  Angelena Form PA-C  [facility]

## 2019-11-12 DIAGNOSIS — E785 Hyperlipidemia, unspecified: Secondary | ICD-10-CM | POA: Diagnosis not present

## 2019-11-12 DIAGNOSIS — Z880 Allergy status to penicillin: Secondary | ICD-10-CM | POA: Diagnosis not present

## 2019-11-12 DIAGNOSIS — J1282 Pneumonia due to coronavirus disease 2019: Secondary | ICD-10-CM | POA: Diagnosis present

## 2019-11-12 DIAGNOSIS — Z7982 Long term (current) use of aspirin: Secondary | ICD-10-CM | POA: Diagnosis not present

## 2019-11-12 DIAGNOSIS — K219 Gastro-esophageal reflux disease without esophagitis: Secondary | ICD-10-CM | POA: Diagnosis present

## 2019-11-12 DIAGNOSIS — J9811 Atelectasis: Secondary | ICD-10-CM | POA: Diagnosis not present

## 2019-11-12 DIAGNOSIS — R059 Cough, unspecified: Secondary | ICD-10-CM | POA: Diagnosis not present

## 2019-11-12 DIAGNOSIS — U071 COVID-19: Secondary | ICD-10-CM | POA: Diagnosis not present

## 2019-11-12 DIAGNOSIS — Z882 Allergy status to sulfonamides status: Secondary | ICD-10-CM | POA: Diagnosis not present

## 2019-11-12 DIAGNOSIS — R0902 Hypoxemia: Secondary | ICD-10-CM | POA: Diagnosis not present

## 2019-11-12 DIAGNOSIS — J189 Pneumonia, unspecified organism: Secondary | ICD-10-CM | POA: Diagnosis not present

## 2019-11-12 DIAGNOSIS — J90 Pleural effusion, not elsewhere classified: Secondary | ICD-10-CM | POA: Diagnosis not present

## 2019-11-12 DIAGNOSIS — R0602 Shortness of breath: Secondary | ICD-10-CM | POA: Diagnosis not present

## 2019-11-20 DIAGNOSIS — Z87891 Personal history of nicotine dependence: Secondary | ICD-10-CM | POA: Diagnosis not present

## 2019-11-20 DIAGNOSIS — Z09 Encounter for follow-up examination after completed treatment for conditions other than malignant neoplasm: Secondary | ICD-10-CM | POA: Diagnosis not present

## 2019-11-20 DIAGNOSIS — J1282 Pneumonia due to coronavirus disease 2019: Secondary | ICD-10-CM | POA: Diagnosis not present

## 2019-11-20 DIAGNOSIS — Z299 Encounter for prophylactic measures, unspecified: Secondary | ICD-10-CM | POA: Diagnosis not present

## 2019-11-20 DIAGNOSIS — U071 COVID-19: Secondary | ICD-10-CM | POA: Diagnosis not present

## 2019-11-27 IMAGING — MR MR LUMBAR SPINE W/O CM
4 of 5 series · 25 of 48 positions shown · non-contrast
Comparison: None.

CLINICAL DATA: Chronic low back pain with left leg pain for 3
months

EXAM:
MRI LUMBAR SPINE WITHOUT CONTRAST
TECHNIQUE: Multiplanar, multisequence MR imaging of the lumbar spine was
performed. No intravenous contrast was administered.

[Series 2: T2 · sagittal · 4.0mm · 0.55mm/px · 6 of 13 slices shown (1 of 2)]
[im 1/13]
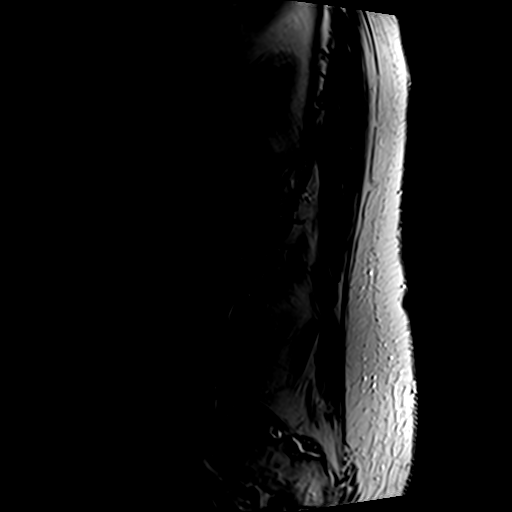
[im 3/13]
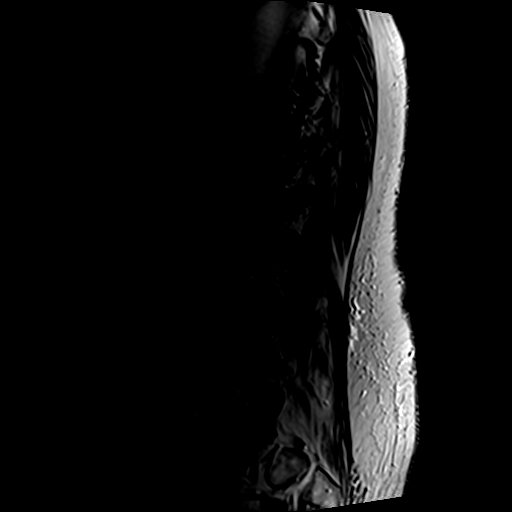
[im 5/13]
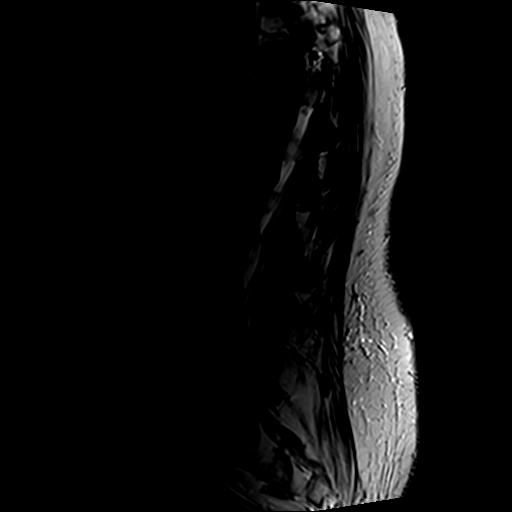
[im 8/13]
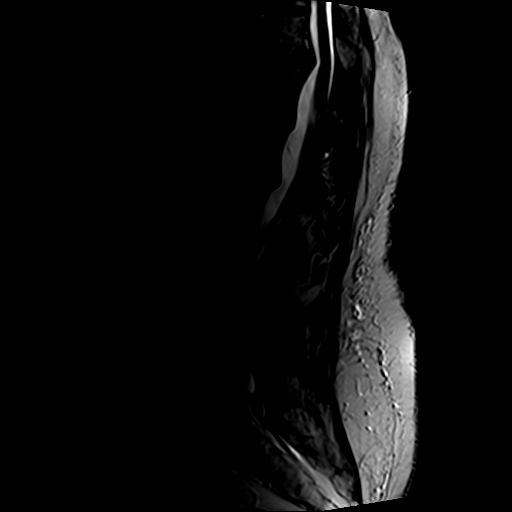
[im 10/13]
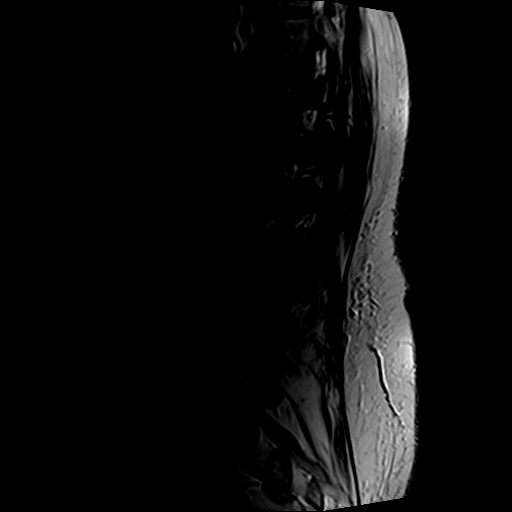
[im 13/13]
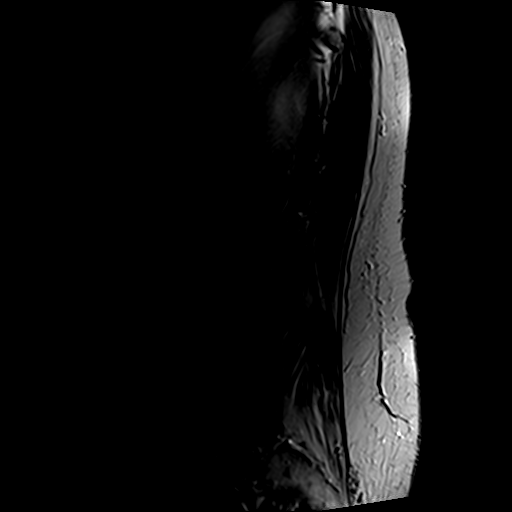

[Series 4: T1 · sagittal · 4.0mm · 0.55mm/px · 6 of 13 slices shown (1 of 2)]
[im 1/13]
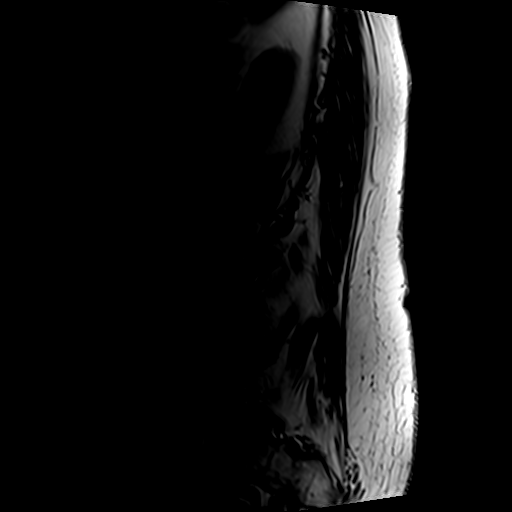
[im 3/13]
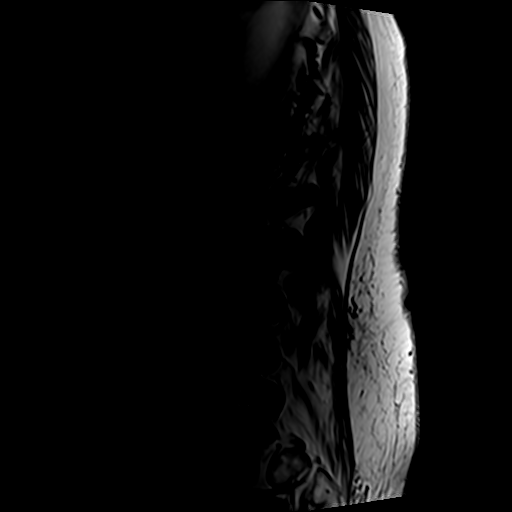
[im 5/13]
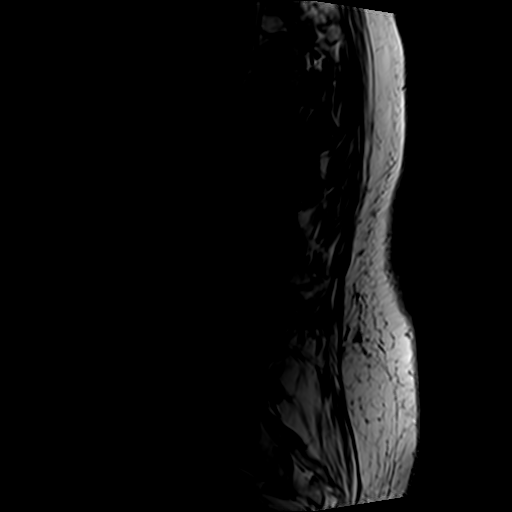
[im 8/13]
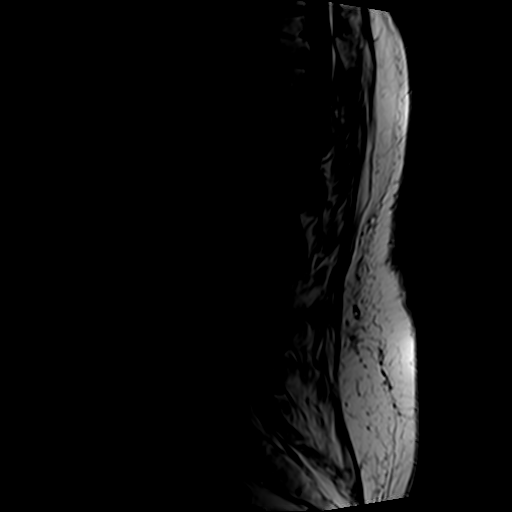
[im 10/13]
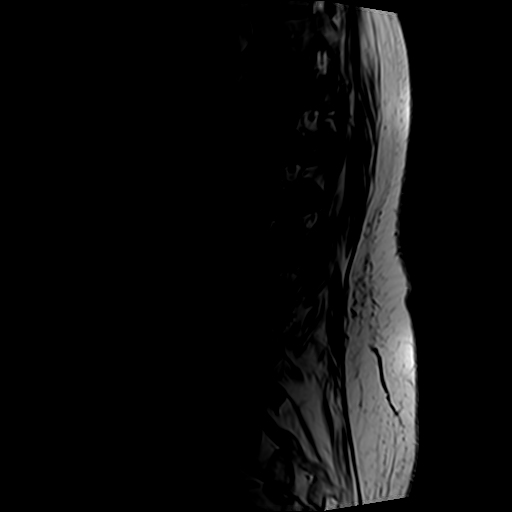
[im 13/13]
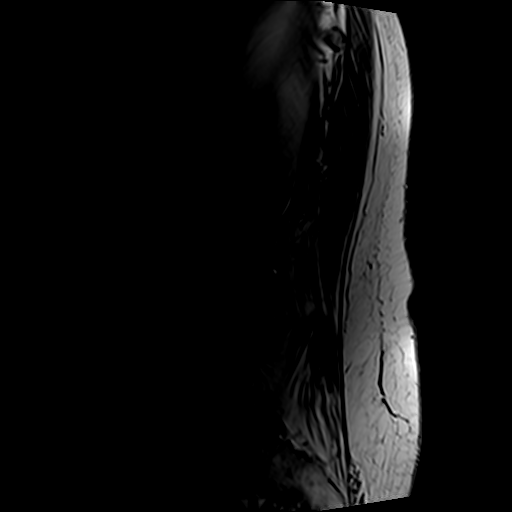

[Series 5: T2 · axial · 4.0mm · 0.70mm/px · z∈[-118,+79]mm · 9 of 35 slices shown (2 of 2)]
[im 1/35]
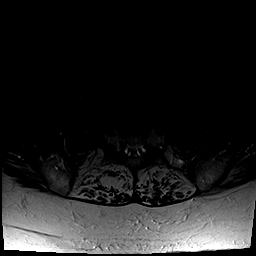
[im 5/35]
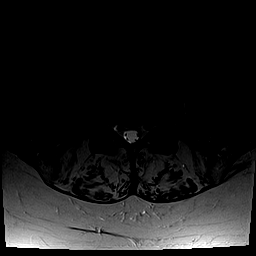
[im 10/35]
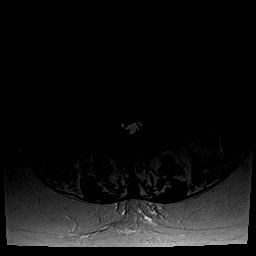
[im 15/35]
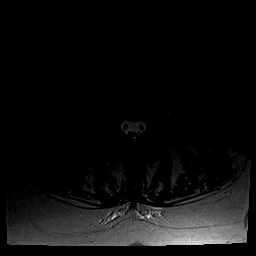
[im 18/35]
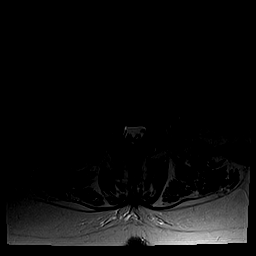
[im 20/35]
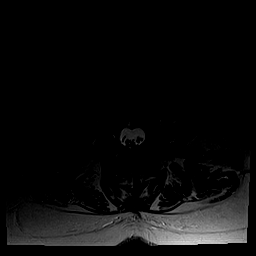
[im 25/35]
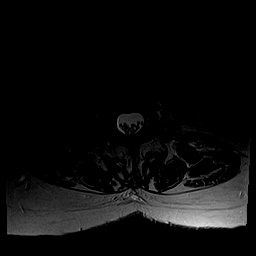
[im 30/35]
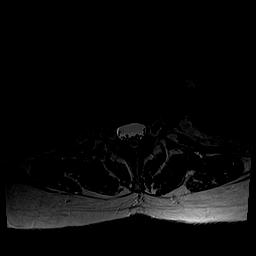
[im 35/35]
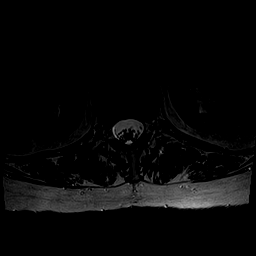

[Series 6: T1 · axial · 4.0mm · 0.35mm/px · z∈[-118,+40]mm · 4 of 35 slices shown (2 of 2)]
[im 1/35]
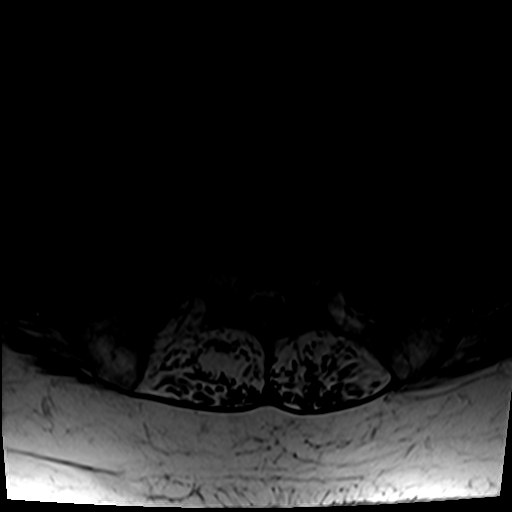
[im 5/35]
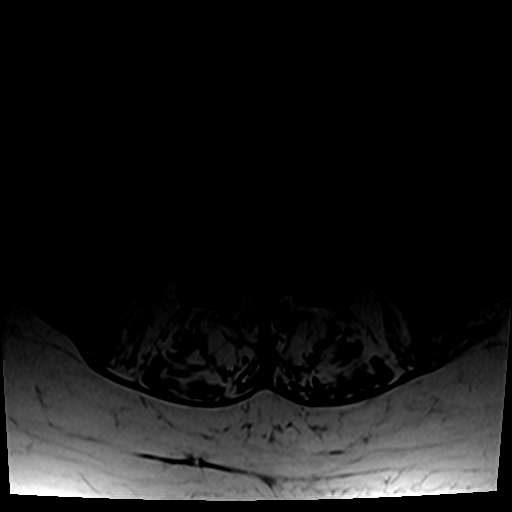
[im 18/35]
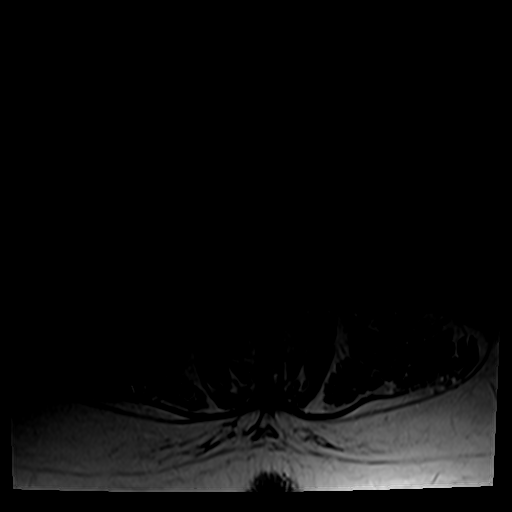
[im 30/35]
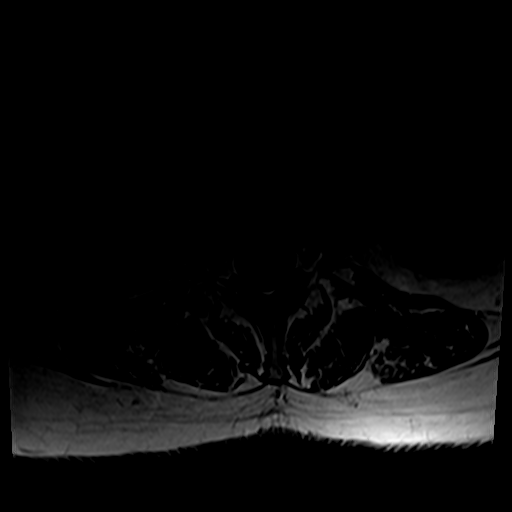

[25 of 48 positions shown; findings below may reference images not displayed]

FINDINGS: Segmentation:  Standard based on the available coverage.

Alignment:  Physiologic.

Vertebrae:  No fracture, evidence of discitis, or bone lesion.

Conus medullaris and cauda equina: Conus extends to the T12-L1
level. Conus and cauda equina appear normal.

Paraspinal and other soft tissues: Fatty atrophy of intrinsic back
muscles.

Disc levels:

T11-12: Central disc protrusion with buttressing osteophyte seen
since at least 0556 thoracic MRI. No associated neural compression.

T12- L1: Unremarkable.

L1-L2: Narrow disc with bulge and tiny central protrusion. No
impingement

L2-L3: Narrow disc with bulge and facet hypertrophy. Mild,
noncompressive spinal stenosis.

L3-L4: Disc narrowing and bulging with asymmetric right subarticular
recess narrowing that could affect the right L4 nerve root.
Noncompressive spinal stenosis

L4-L5: Disc narrowing and bulging with posterior element
hypertrophy. There is a superimposed downward pointing central disc
protrusion that is small. Right more than left subarticular recess
narrowing that could affect either L5 nerve root.

L5-S1:Disc narrowing with a shallow central protrusion. There is a
right inferior foraminal protrusion without neural contact on
sagittal images. No compressive stenosis.
IMPRESSION: 1. Generalized disc narrowing and bulge with mild posterior element
hypertrophy.
2. Mild spinal stenosis from L2-3 to L4-5.
3. Subarticular recess narrowing that could affect the right L4 and
either L5 nerve roots.

## 2019-12-05 DIAGNOSIS — Z299 Encounter for prophylactic measures, unspecified: Secondary | ICD-10-CM | POA: Diagnosis not present

## 2019-12-05 DIAGNOSIS — J1282 Pneumonia due to coronavirus disease 2019: Secondary | ICD-10-CM | POA: Diagnosis not present

## 2019-12-05 DIAGNOSIS — U071 COVID-19: Secondary | ICD-10-CM | POA: Diagnosis not present

## 2019-12-05 DIAGNOSIS — Z713 Dietary counseling and surveillance: Secondary | ICD-10-CM | POA: Diagnosis not present

## 2019-12-12 ENCOUNTER — Ambulatory Visit (INDEPENDENT_AMBULATORY_CARE_PROVIDER_SITE_OTHER): Payer: Medicare Other

## 2019-12-12 ENCOUNTER — Ambulatory Visit (INDEPENDENT_AMBULATORY_CARE_PROVIDER_SITE_OTHER): Payer: Medicare Other | Admitting: Physician Assistant

## 2019-12-12 DIAGNOSIS — M79671 Pain in right foot: Secondary | ICD-10-CM | POA: Diagnosis not present

## 2019-12-12 DIAGNOSIS — I809 Phlebitis and thrombophlebitis of unspecified site: Secondary | ICD-10-CM | POA: Diagnosis not present

## 2019-12-12 MED ORDER — CEPHALEXIN 500 MG PO CAPS
500.0000 mg | ORAL_CAPSULE | Freq: Three times a day (TID) | ORAL | 0 refills | Status: DC
Start: 2019-12-12 — End: 2021-01-21

## 2019-12-12 MED ORDER — MELOXICAM 7.5 MG PO TABS: 7.5000 mg | ORAL_TABLET | Freq: Every day | ORAL | 0 refills | Status: DC

## 2019-12-12 MED ORDER — CEPHALEXIN 500 MG PO CAPS: 500.0000 mg | ORAL_CAPSULE | Freq: Three times a day (TID) | ORAL | 0 refills | Status: DC

## 2019-12-12 NOTE — Progress Notes (Addendum)
Office Visit Note   Patient: [name]           Date of Birth: 1945-01-29           MRN: [id number] Visit Date: 12/12/2019              Requested by: [name], MD [place],  [place] [address] PCP: [name], MD  No chief complaint on file.     HPI: Is a pleasant 74 year old woman who comes with a chief complaint of right dorsal foot and ankle pain.  This has associated redness and tenderness.  She denies any fever or chills.  She denies any injuries.  She states that she just woke up with this pain that goes from the dorsum of her foot across her ankle and up to her lower leg.  She denies any calf pain.  Assessment & Plan: Visit Diagnoses:  1. Pain in right foot     Plan: Findings consistent with a phlebitis.  I discussed the natural history of this.  I placed her on a course of Keflex.  She should also begin 1 aspirin daily.  Also 1 Mobic daily for inflammation.  She will follow-up in 1 week.  She knows if anything progressed she is to go to the emergency room.  I have also furnished the patient with a medium vive compression stocking and asked that she wear this 24/7 taking it off to shower.  She may obtain another one and I given her information on how to do this or can get one from her local pharmacy if she is unable to get it any other way.  Follow-Up Instructions: No follow-ups on file.   Ortho Exam  Patient is alert, oriented, no adenopathy, well-dressed, normal affect, normal respiratory effort. Right foot: She has some erythema and is acutely tender to palpation along the course of the venous path anteriorly from the dorsum of the foot up into anterior tibia.  No tenderness with ankle range of motion no tenderness with manipulation of her foot.  Compartments are soft and compressible in the calf of her leg and she has a negative Homans' sign.  No ascending cellulitis.  Imaging: No results found. No images are attached to the encounter.  Labs: Lab  Results  Component Value Date   HGBA1C 5.3 06/15/2015     No results found for: ALBUMIN, PREALBUMIN, LABURIC  No results found for: MG No results found for: VD25OH  No results found for: PREALBUMIN CBC EXTENDED Latest Ref Rng & Units 06/19/2015 06/17/2015 06/16/2015  WBC 4.0 - 10.5 K/uL 7.2 6.9 8.4  RBC 3.87 - 5.11 MIL/uL 3.75(L) 3.10(L) 3.36(L)  HGB 12.0 - 15.0 g/dL 11.5(L) 9.2(L) 9.7(L)  HCT 36 - 46 % 33.7(L) 28.5(L) 30.2(L)  PLT 150 - 400 K/uL 258 155 167  NEUTROABS 1.7 - 7.7 K/uL 5.0 - -  LYMPHSABS 0.7 - 4.0 K/uL 1.2 - -     There is no height or weight on file to calculate BMI.  Orders:  Orders Placed This Encounter  Procedures  . XR Foot Complete Right   Meds ordered this encounter  Medications  . meloxicam (MOBIC) 7.5 MG tablet    Sig: Take 1 tablet (7.5 mg total) by mouth daily.    Dispense:  30 tablet    Refill:  0  . DISCONTD: cephALEXin (KEFLEX) 500 MG capsule    Sig: Take 1 capsule (500 mg total) by mouth 3 (three) times daily.  Dispense:  30 capsule    Refill:  0  . cephALEXin (KEFLEX) 500 MG capsule    Sig: Take 1 capsule (500 mg total) by mouth 3 (three) times daily.    Dispense:  30 capsule    Refill:  0     Procedures: No procedures performed  Clinical Data: No additional findings.  ROS:  All other systems negative, except as noted in the HPI. Review of Systems  Objective: Vital Signs: There were no vitals taken for this visit.  Specialty Comments:  No specialty comments available.  PMFS History: Patient Active Problem List   Diagnosis Date Noted  . Closed left hip fracture ([place]) 07/02/2015  . Anemia, unspecified 07/02/2015   Past Medical History:  Diagnosis Date  . Distal radius fracture, right    mechanical fall  . Hip fracture, left ([place])    2017  . Hyperglycemia   . Kidney stone    X 2  . UTI (lower urinary tract infection)    06/2015    Family History  Problem Relation Age of Onset  . Cancer Father        cns  .  Diabetes Neg Hx   . Heart disease Neg Hx   . Stroke Neg Hx     Past Surgical History:  Procedure Laterality Date  . CARDIAC CATHETERIZATION  2005   [facility]-report in [place] in epic  . CERVICAL FUSION  2006  . COLONOSCOPY    . COLONOSCOPY N/A 08/19/2014   Procedure: COLONOSCOPY;  Surgeon: Rogene Houston, MD;  [facility];  Service: Endoscopy;  Laterality: N/A;  830  . FINGER ARTHROPLASTY  9/13   lt long and index  . HEMORRHOID SURGERY    . INTRAMEDULLARY (IM) NAIL INTERTROCHANTERIC Left 06/14/2015   Procedure: INTRAMEDULLARY (IM) NAIL INTERTROCHANTRIC;  Surgeon: Newt Minion, MD;  Location: [facility];  Service: Orthopedics;  Laterality: Left;  . OPEN REDUCTION INTERNAL FIXATION (ORIF) DISTAL RADIAL FRACTURE Right 06/25/2014   Procedure: OPEN REDUCTION INTERNAL FIXATION (ORIF) RIGHT  DISTAL RADIUS FRACTURE;  Surgeon: Leanora Cover, MD;  Location: [facility];  Service: Orthopedics;  Laterality: Right;  . TENOLYSIS Left 05/02/2012   Procedure: LEFT LONG TENOLYSIS CAPSULE RELEASES MANIPULATION OF IP/MP JOINTS;  Surgeon: Cammie Sickle., MD;  Location: [facility];  Service: Orthopedics;  Laterality: Left;  . TUBAL LIGATION     Social History   Occupational History  . Not on file  Tobacco Use  . Smoking status: Former Smoker    Quit date: 10/03/1986    Years since quitting: 33.2  . Smokeless tobacco: Never Used  Substance and Sexual Activity  . Alcohol use: No  . Drug use: No  . Sexual activity: Not on file

## 2019-12-23 ENCOUNTER — Other Ambulatory Visit: Payer: Self-pay | Admitting: Physician Assistant

## 2019-12-23 DIAGNOSIS — R609 Edema, unspecified: Secondary | ICD-10-CM | POA: Diagnosis not present

## 2019-12-23 DIAGNOSIS — L0390 Cellulitis, unspecified: Secondary | ICD-10-CM | POA: Diagnosis not present

## 2019-12-23 DIAGNOSIS — M545 Low back pain, unspecified: Secondary | ICD-10-CM | POA: Diagnosis not present

## 2019-12-23 DIAGNOSIS — Z299 Encounter for prophylactic measures, unspecified: Secondary | ICD-10-CM | POA: Diagnosis not present

## 2020-01-12 DIAGNOSIS — L0390 Cellulitis, unspecified: Secondary | ICD-10-CM | POA: Diagnosis not present

## 2020-01-12 DIAGNOSIS — Z299 Encounter for prophylactic measures, unspecified: Secondary | ICD-10-CM | POA: Diagnosis not present

## 2020-01-12 DIAGNOSIS — R609 Edema, unspecified: Secondary | ICD-10-CM | POA: Diagnosis not present

## 2020-01-12 DIAGNOSIS — I739 Peripheral vascular disease, unspecified: Secondary | ICD-10-CM | POA: Diagnosis not present

## 2020-01-20 DIAGNOSIS — Z789 Other specified health status: Secondary | ICD-10-CM | POA: Diagnosis not present

## 2020-01-20 DIAGNOSIS — I739 Peripheral vascular disease, unspecified: Secondary | ICD-10-CM | POA: Diagnosis not present

## 2020-01-20 DIAGNOSIS — M7989 Other specified soft tissue disorders: Secondary | ICD-10-CM | POA: Diagnosis not present

## 2020-01-20 DIAGNOSIS — R5383 Other fatigue: Secondary | ICD-10-CM | POA: Diagnosis not present

## 2020-01-20 DIAGNOSIS — Z299 Encounter for prophylactic measures, unspecified: Secondary | ICD-10-CM | POA: Diagnosis not present

## 2020-01-28 DIAGNOSIS — R609 Edema, unspecified: Secondary | ICD-10-CM | POA: Diagnosis not present

## 2020-01-28 DIAGNOSIS — R600 Localized edema: Secondary | ICD-10-CM | POA: Diagnosis not present

## 2020-01-29 DIAGNOSIS — I70219 Atherosclerosis of native arteries of extremities with intermittent claudication, unspecified extremity: Secondary | ICD-10-CM | POA: Diagnosis not present

## 2020-01-29 DIAGNOSIS — I739 Peripheral vascular disease, unspecified: Secondary | ICD-10-CM | POA: Diagnosis not present

## 2020-05-24 DIAGNOSIS — I739 Peripheral vascular disease, unspecified: Secondary | ICD-10-CM | POA: Diagnosis not present

## 2020-05-24 DIAGNOSIS — H6090 Unspecified otitis externa, unspecified ear: Secondary | ICD-10-CM | POA: Diagnosis not present

## 2020-05-24 DIAGNOSIS — Z299 Encounter for prophylactic measures, unspecified: Secondary | ICD-10-CM | POA: Diagnosis not present

## 2020-05-25 DIAGNOSIS — H33312 Horseshoe tear of retina without detachment, left eye: Secondary | ICD-10-CM | POA: Diagnosis not present

## 2020-05-25 DIAGNOSIS — H2513 Age-related nuclear cataract, bilateral: Secondary | ICD-10-CM | POA: Diagnosis not present

## 2020-06-09 DIAGNOSIS — H33312 Horseshoe tear of retina without detachment, left eye: Secondary | ICD-10-CM | POA: Diagnosis not present

## 2020-07-14 DIAGNOSIS — H33312 Horseshoe tear of retina without detachment, left eye: Secondary | ICD-10-CM | POA: Diagnosis not present

## 2020-07-23 DIAGNOSIS — Z299 Encounter for prophylactic measures, unspecified: Secondary | ICD-10-CM | POA: Diagnosis not present

## 2020-07-23 DIAGNOSIS — H60509 Unspecified acute noninfective otitis externa, unspecified ear: Secondary | ICD-10-CM | POA: Diagnosis not present

## 2020-07-23 DIAGNOSIS — I739 Peripheral vascular disease, unspecified: Secondary | ICD-10-CM | POA: Diagnosis not present

## 2020-08-02 DIAGNOSIS — K449 Diaphragmatic hernia without obstruction or gangrene: Secondary | ICD-10-CM | POA: Diagnosis not present

## 2020-08-02 DIAGNOSIS — K8020 Calculus of gallbladder without cholecystitis without obstruction: Secondary | ICD-10-CM | POA: Diagnosis not present

## 2020-08-02 DIAGNOSIS — N200 Calculus of kidney: Secondary | ICD-10-CM | POA: Diagnosis not present

## 2020-08-02 DIAGNOSIS — R109 Unspecified abdominal pain: Secondary | ICD-10-CM | POA: Diagnosis not present

## 2020-08-02 DIAGNOSIS — Z87442 Personal history of urinary calculi: Secondary | ICD-10-CM | POA: Diagnosis not present

## 2020-08-02 DIAGNOSIS — K5730 Diverticulosis of large intestine without perforation or abscess without bleeding: Secondary | ICD-10-CM | POA: Diagnosis not present

## 2020-08-02 DIAGNOSIS — I700 Atherosclerosis of aorta: Secondary | ICD-10-CM | POA: Diagnosis not present

## 2020-08-09 DIAGNOSIS — Z6830 Body mass index (BMI) 30.0-30.9, adult: Secondary | ICD-10-CM | POA: Diagnosis not present

## 2020-08-09 DIAGNOSIS — M545 Low back pain, unspecified: Secondary | ICD-10-CM | POA: Diagnosis not present

## 2020-08-09 DIAGNOSIS — R350 Frequency of micturition: Secondary | ICD-10-CM | POA: Diagnosis not present

## 2020-08-09 DIAGNOSIS — N390 Urinary tract infection, site not specified: Secondary | ICD-10-CM | POA: Diagnosis not present

## 2020-08-09 DIAGNOSIS — Z299 Encounter for prophylactic measures, unspecified: Secondary | ICD-10-CM | POA: Diagnosis not present

## 2020-08-09 DIAGNOSIS — I739 Peripheral vascular disease, unspecified: Secondary | ICD-10-CM | POA: Diagnosis not present

## 2020-08-31 DIAGNOSIS — Z79899 Other long term (current) drug therapy: Secondary | ICD-10-CM | POA: Diagnosis not present

## 2020-08-31 DIAGNOSIS — R5383 Other fatigue: Secondary | ICD-10-CM | POA: Diagnosis not present

## 2020-08-31 DIAGNOSIS — Z299 Encounter for prophylactic measures, unspecified: Secondary | ICD-10-CM | POA: Diagnosis not present

## 2020-08-31 DIAGNOSIS — Z1331 Encounter for screening for depression: Secondary | ICD-10-CM | POA: Diagnosis not present

## 2020-08-31 DIAGNOSIS — Z1339 Encounter for screening examination for other mental health and behavioral disorders: Secondary | ICD-10-CM | POA: Diagnosis not present

## 2020-08-31 DIAGNOSIS — Z7189 Other specified counseling: Secondary | ICD-10-CM | POA: Diagnosis not present

## 2020-08-31 DIAGNOSIS — E7800 Pure hypercholesterolemia, unspecified: Secondary | ICD-10-CM | POA: Diagnosis not present

## 2020-08-31 DIAGNOSIS — Z0000 Encounter for general adult medical examination without abnormal findings: Secondary | ICD-10-CM | POA: Diagnosis not present

## 2020-09-02 ENCOUNTER — Other Ambulatory Visit: Payer: Self-pay

## 2020-09-02 ENCOUNTER — Ambulatory Visit (INDEPENDENT_AMBULATORY_CARE_PROVIDER_SITE_OTHER): Payer: Medicare Other | Admitting: Otolaryngology

## 2020-09-02 DIAGNOSIS — H9202 Otalgia, left ear: Secondary | ICD-10-CM

## 2020-09-02 DIAGNOSIS — L299 Pruritus, unspecified: Secondary | ICD-10-CM | POA: Diagnosis not present

## 2020-09-02 DIAGNOSIS — M26609 Unspecified temporomandibular joint disorder, unspecified side: Secondary | ICD-10-CM

## 2020-09-02 NOTE — Progress Notes (Signed)
HPI: [name] is a 75 y.o. female who presents is referred by her PCP for evaluation of left ear symptoms.  She has been having intermittent left ear pain as well as chronic itching in her ears for the past couple years.  She has been diagnosed with eczema and has been prescribed triamcinolone 0.1% cream to the ear as needed itching.  She has previously seen dermatology for eczema.  She is having no hearing problems. No sore throat..  Past Medical History:  Diagnosis Date   Distal radius fracture, right    mechanical fall   Hip fracture, left ([place])    2017   Hyperglycemia    Kidney stone    X 2   UTI (lower urinary tract infection)    06/2015   Past Surgical History:  Procedure Laterality Date   CARDIAC CATHETERIZATION  2005   [facility]-report in echart-not in epic   CERVICAL FUSION  2006   COLONOSCOPY     COLONOSCOPY N/A 08/19/2014   Procedure: COLONOSCOPY;  Surgeon: Rogene Houston, MD;  [facility];  Service: Endoscopy;  Laterality: N/A;  [place]  9/13   lt long and index   HEMORRHOID SURGERY     INTRAMEDULLARY (IM) NAIL INTERTROCHANTERIC Left 06/14/2015   Procedure: INTRAMEDULLARY (IM) NAIL INTERTROCHANTRIC;  Surgeon: Newt Minion, MD;  Location: [facility];  Service: Orthopedics;  Laterality: Left;   OPEN REDUCTION INTERNAL FIXATION (ORIF) DISTAL RADIAL FRACTURE Right 06/25/2014   Procedure: OPEN REDUCTION INTERNAL FIXATION (ORIF) RIGHT  DISTAL RADIUS FRACTURE;  Surgeon: Leanora Cover, MD;  Location: [facility];  Service: Orthopedics;  Laterality: Right;   TENOLYSIS Left 05/02/2012   Procedure: LEFT LONG TENOLYSIS CAPSULE RELEASES MANIPULATION OF IP/MP JOINTS;  Surgeon: Cammie Sickle., MD;  Location: [facility];  Service: Orthopedics;  Laterality: Left;   TUBAL LIGATION     Social History   Socioeconomic History   Marital status: Single    Spouse name: Not on file   Number of children: Not on file   Years of  education: Not on file   Highest education level: Not on file  Occupational History   Not on file  Tobacco Use   Smoking status: Former    Types: Cigarettes    Quit date: 10/03/1986    Years since quitting: 33.9   Smokeless tobacco: Never  Substance and Sexual Activity   Alcohol use: No   Drug use: No   Sexual activity: Not on file  Other Topics Concern   Not on file  Social History Narrative   Not on file   Social Determinants of Health   Financial Resource Strain: Not on file  Food Insecurity: Not on file  Transportation Needs: Not on file  Physical Activity: Not on file  Stress: Not on file  Social Connections: Not on file   Family History  Problem Relation Age of Onset   Cancer Father        cns   Diabetes Neg Hx    Heart disease Neg Hx    Stroke Neg Hx    Allergies  Allergen Reactions   Sulfa Antibiotics Rash   Penicillins Hives   Other Rash    walnut   Prior to Admission medications   Medication Sig Start Date End Date Taking? Authorizing Provider  aspirin EC 81 MG tablet Take 1 tablet (81 mg total) by mouth daily. 06/14/15   Newt Minion, MD  cephALEXin (KEFLEX) 500 MG capsule Take 1 capsule (500 mg total) by mouth 3 (three) times daily. 12/12/19   Persons, Bevely Palmer, PA  meloxicam (MOBIC) 7.5 MG tablet TAKE 1 TABLET BY MOUTH EVERY DAY 12/24/19   Persons, Bevely Palmer, PA  predniSONE (DELTASONE) 10 MG tablet TAKE 2 TABLETS (20 MG TOTAL) BY MOUTH DAILY WITH BREAKFAST. 10/15/17   Newt Minion, MD  predniSONE (DELTASONE) 10 MG tablet Take 2 tablets (20 mg total) by mouth daily with breakfast. 12/23/18   Persons, Bevely Palmer, PA  sennosides-docusate sodium (SENOKOT-S) 8.6-50 MG tablet Take 1 tablet by mouth as needed for constipation.    [provider]  vitamin C (ASCORBIC ACID) 500 MG tablet Take 1 tablet (500 mg total) by mouth daily. 06/25/14   Leanora Cover, MD     Positive ROS: Otherwise negative  All other systems have been reviewed and were  otherwise negative with the exception of those mentioned in the HPI and as above.  Physical Exam: Constitutional: Alert, well-appearing, no acute distress Ears: External ears without lesions or tenderness. Ear canals are clear bilaterally with intact, clear TMs.  Both ear canals are clear with no inflammatory changes.  Minimal scabbing and crusting.  Both TMs are clear with good mobility on pneumatic otoscopy. Nasal: External nose without lesions. Septum with mild deformity and mild rhinitis.. Clear nasal passages otherwise. Oral: Lips and gums without lesions. Tongue and palate mucosa without lesions. Posterior oropharynx clear.  Tonsils are small and symmetric bilaterally. Neck: No palpable adenopathy or masses.  On palpation of the TMJ regions she has obvious left TMJ arthritis with abnormal mobility and crunching sound in the left TMJ region. Respiratory: Breathing comfortably  Skin: No facial/neck lesions or rash noted.  Procedures  Assessment: Agree with assessment of itching secondary to dry skin or eczema.  There is no evidence of active infection requiring antibiotic eardrops but steroid cream would be beneficial. Left ear pain I suspect is related to TMJ dysfunction  Plan: Prescribed Diprolene 0.05% lotion or cream to use in the ear twice daily for 3 to 4 days as needed itching.  She can compare this to the triamcinolone cream that she already has. Reassured her of no signs of infection and that the itching is related to skin changes. She will follow-up as needed   Radene Journey, MD   CC:

## 2020-10-01 DIAGNOSIS — H33312 Horseshoe tear of retina without detachment, left eye: Secondary | ICD-10-CM | POA: Diagnosis not present

## 2020-10-01 DIAGNOSIS — H2513 Age-related nuclear cataract, bilateral: Secondary | ICD-10-CM | POA: Diagnosis not present

## 2020-10-04 DIAGNOSIS — Z20822 Contact with and (suspected) exposure to covid-19: Secondary | ICD-10-CM | POA: Diagnosis not present

## 2020-10-09 DIAGNOSIS — R059 Cough, unspecified: Secondary | ICD-10-CM | POA: Diagnosis not present

## 2020-10-09 DIAGNOSIS — U071 COVID-19: Secondary | ICD-10-CM | POA: Diagnosis not present

## 2020-10-09 DIAGNOSIS — Z20822 Contact with and (suspected) exposure to covid-19: Secondary | ICD-10-CM | POA: Diagnosis not present

## 2020-10-25 DIAGNOSIS — Z299 Encounter for prophylactic measures, unspecified: Secondary | ICD-10-CM | POA: Diagnosis not present

## 2020-10-25 DIAGNOSIS — R350 Frequency of micturition: Secondary | ICD-10-CM | POA: Diagnosis not present

## 2020-10-25 DIAGNOSIS — E7800 Pure hypercholesterolemia, unspecified: Secondary | ICD-10-CM | POA: Diagnosis not present

## 2020-10-25 DIAGNOSIS — G4700 Insomnia, unspecified: Secondary | ICD-10-CM | POA: Diagnosis not present

## 2020-10-25 DIAGNOSIS — D692 Other nonthrombocytopenic purpura: Secondary | ICD-10-CM | POA: Diagnosis not present

## 2020-11-09 DIAGNOSIS — Z20822 Contact with and (suspected) exposure to covid-19: Secondary | ICD-10-CM | POA: Diagnosis not present

## 2020-12-15 DIAGNOSIS — Z23 Encounter for immunization: Secondary | ICD-10-CM | POA: Diagnosis not present

## 2020-12-27 DIAGNOSIS — Z6830 Body mass index (BMI) 30.0-30.9, adult: Secondary | ICD-10-CM | POA: Diagnosis not present

## 2020-12-27 DIAGNOSIS — Z299 Encounter for prophylactic measures, unspecified: Secondary | ICD-10-CM | POA: Diagnosis not present

## 2020-12-27 DIAGNOSIS — M545 Low back pain, unspecified: Secondary | ICD-10-CM | POA: Diagnosis not present

## 2020-12-27 DIAGNOSIS — N390 Urinary tract infection, site not specified: Secondary | ICD-10-CM | POA: Diagnosis not present

## 2020-12-27 DIAGNOSIS — R059 Cough, unspecified: Secondary | ICD-10-CM | POA: Diagnosis not present

## 2021-01-11 DIAGNOSIS — N390 Urinary tract infection, site not specified: Secondary | ICD-10-CM | POA: Diagnosis not present

## 2021-01-11 DIAGNOSIS — D692 Other nonthrombocytopenic purpura: Secondary | ICD-10-CM | POA: Diagnosis not present

## 2021-01-11 DIAGNOSIS — I739 Peripheral vascular disease, unspecified: Secondary | ICD-10-CM | POA: Diagnosis not present

## 2021-01-11 DIAGNOSIS — Z789 Other specified health status: Secondary | ICD-10-CM | POA: Diagnosis not present

## 2021-01-11 DIAGNOSIS — R350 Frequency of micturition: Secondary | ICD-10-CM | POA: Diagnosis not present

## 2021-01-11 DIAGNOSIS — Z299 Encounter for prophylactic measures, unspecified: Secondary | ICD-10-CM | POA: Diagnosis not present

## 2021-01-11 DIAGNOSIS — Z6830 Body mass index (BMI) 30.0-30.9, adult: Secondary | ICD-10-CM | POA: Diagnosis not present

## 2021-01-18 DIAGNOSIS — K5730 Diverticulosis of large intestine without perforation or abscess without bleeding: Secondary | ICD-10-CM | POA: Diagnosis not present

## 2021-01-18 DIAGNOSIS — Z6830 Body mass index (BMI) 30.0-30.9, adult: Secondary | ICD-10-CM | POA: Diagnosis not present

## 2021-01-18 DIAGNOSIS — Z9889 Other specified postprocedural states: Secondary | ICD-10-CM | POA: Diagnosis not present

## 2021-01-18 DIAGNOSIS — R109 Unspecified abdominal pain: Secondary | ICD-10-CM | POA: Diagnosis not present

## 2021-01-18 DIAGNOSIS — I700 Atherosclerosis of aorta: Secondary | ICD-10-CM | POA: Diagnosis not present

## 2021-01-18 DIAGNOSIS — Z299 Encounter for prophylactic measures, unspecified: Secondary | ICD-10-CM | POA: Diagnosis not present

## 2021-01-18 DIAGNOSIS — R52 Pain, unspecified: Secondary | ICD-10-CM | POA: Diagnosis not present

## 2021-01-18 DIAGNOSIS — K8020 Calculus of gallbladder without cholecystitis without obstruction: Secondary | ICD-10-CM | POA: Diagnosis not present

## 2021-01-19 ENCOUNTER — Emergency Department (HOSPITAL_COMMUNITY): Payer: Medicare Other

## 2021-01-19 ENCOUNTER — Inpatient Hospital Stay (HOSPITAL_COMMUNITY)
Admission: EM | Admit: 2021-01-19 | Discharge: 2021-01-21 | DRG: 418 | Disposition: A | Discharge status: Home / Self Care | Payer: Medicare Other | Attending: General Surgery | Admitting: General Surgery

## 2021-01-19 ENCOUNTER — Encounter (HOSPITAL_COMMUNITY): Payer: Self-pay | Admitting: Emergency Medicine

## 2021-01-19 ENCOUNTER — Other Ambulatory Visit: Payer: Self-pay

## 2021-01-19 DIAGNOSIS — Z87891 Personal history of nicotine dependence: Secondary | ICD-10-CM

## 2021-01-19 DIAGNOSIS — Z981 Arthrodesis status: Secondary | ICD-10-CM

## 2021-01-19 DIAGNOSIS — N390 Urinary tract infection, site not specified: Secondary | ICD-10-CM | POA: Diagnosis present

## 2021-01-19 DIAGNOSIS — Z888 Allergy status to other drugs, medicaments and biological substances status: Secondary | ICD-10-CM | POA: Diagnosis not present

## 2021-01-19 DIAGNOSIS — Z880 Allergy status to penicillin: Secondary | ICD-10-CM

## 2021-01-19 DIAGNOSIS — Z7982 Long term (current) use of aspirin: Secondary | ICD-10-CM | POA: Diagnosis not present

## 2021-01-19 DIAGNOSIS — Z23 Encounter for immunization: Secondary | ICD-10-CM | POA: Diagnosis not present

## 2021-01-19 DIAGNOSIS — Z882 Allergy status to sulfonamides status: Secondary | ICD-10-CM | POA: Diagnosis not present

## 2021-01-19 DIAGNOSIS — Z87442 Personal history of urinary calculi: Secondary | ICD-10-CM

## 2021-01-19 DIAGNOSIS — Z79899 Other long term (current) drug therapy: Secondary | ICD-10-CM

## 2021-01-19 DIAGNOSIS — K8010 Calculus of gallbladder with chronic cholecystitis without obstruction: Secondary | ICD-10-CM | POA: Diagnosis not present

## 2021-01-19 DIAGNOSIS — Z7952 Long term (current) use of systemic steroids: Secondary | ICD-10-CM | POA: Diagnosis not present

## 2021-01-19 DIAGNOSIS — R109 Unspecified abdominal pain: Secondary | ICD-10-CM | POA: Diagnosis not present

## 2021-01-19 DIAGNOSIS — K8020 Calculus of gallbladder without cholecystitis without obstruction: Principal | ICD-10-CM

## 2021-01-19 DIAGNOSIS — Z20822 Contact with and (suspected) exposure to covid-19: Secondary | ICD-10-CM | POA: Diagnosis present

## 2021-01-19 DIAGNOSIS — K824 Cholesterolosis of gallbladder: Secondary | ICD-10-CM | POA: Diagnosis not present

## 2021-01-19 LAB — CBC WITH DIFFERENTIAL/PLATELET
Abs Immature Granulocytes: 0.03 10*3/uL (ref 0.00–0.07)
Basophils Absolute: 0 10*3/uL (ref 0.0–0.1)
Basophils Relative: 0 %
Eosinophils Absolute: 0 10*3/uL (ref 0.0–0.5)
Eosinophils Relative: 0 %
HCT: 43.6 % (ref 36.0–46.0)
Hemoglobin: 14.6 g/dL (ref 12.0–15.0)
Immature Granulocytes: 0 %
Lymphocytes Relative: 10 %
Lymphs Abs: 0.8 10*3/uL (ref 0.7–4.0)
MCH: 30.6 pg (ref 26.0–34.0)
MCHC: 33.5 g/dL (ref 30.0–36.0)
MCV: 91.4 fL (ref 80.0–100.0)
Monocytes Absolute: 0.6 10*3/uL (ref 0.1–1.0)
Monocytes Relative: 8 %
Neutro Abs: 6.6 10*3/uL (ref 1.7–7.7)
Neutrophils Relative %: 82 %
Platelets: 243 10*3/uL (ref 150–400)
RBC: 4.77 MIL/uL (ref 3.87–5.11)
RDW: 13.4 % (ref 11.5–15.5)
WBC: 8.1 10*3/uL (ref 4.0–10.5)
nRBC: 0 % (ref 0.0–0.2)

## 2021-01-19 LAB — RESP PANEL BY RT-PCR (FLU A&B, COVID) ARPGX2
Influenza A by PCR: NEGATIVE
Influenza B by PCR: NEGATIVE
SARS Coronavirus 2 by RT PCR: NEGATIVE

## 2021-01-19 LAB — COMPREHENSIVE METABOLIC PANEL
ALT: 11 U/L (ref 0–44)
AST: 21 U/L (ref 15–41)
Albumin: 4.2 g/dL (ref 3.5–5.0)
Alkaline Phosphatase: 77 U/L (ref 38–126)
Anion gap: 8 (ref 5–15)
BUN: 18 mg/dL (ref 8–23)
CO2: 24 mmol/L (ref 22–32)
Calcium: 9.2 mg/dL (ref 8.9–10.3)
Chloride: 107 mmol/L (ref 98–111)
Creatinine, Ser: 0.73 mg/dL (ref 0.44–1.00)
GFR, Estimated: >60 mL/min (ref >60)
Glucose, Bld: 120 mg/dL — ABNORMAL HIGH (ref 70–99)
Potassium: 3.9 mmol/L (ref 3.5–5.1)
Sodium: 139 mmol/L (ref 135–145)
Total Bilirubin: 0.9 mg/dL (ref 0.3–1.2)
Total Protein: 6.7 g/dL (ref 6.5–8.1)

## 2021-01-19 LAB — LIPASE, BLOOD: Lipase: 31 U/L (ref 11–51)

## 2021-01-19 LAB — URINALYSIS, ROUTINE W REFLEX MICROSCOPIC
Bilirubin Urine: NEGATIVE
Glucose, UA: NEGATIVE mg/dL
Ketones, ur: 20 mg/dL — AB
Nitrite: NEGATIVE
Protein, ur: 30 mg/dL — AB
Specific Gravity, Urine: 1.028 (ref 1.005–1.030)
pH: 5 (ref 5.0–8.0)

## 2021-01-19 LAB — SURGICAL PCR SCREEN
MRSA, PCR: NEGATIVE
Staphylococcus aureus: NEGATIVE

## 2021-01-19 MED ORDER — ONDANSETRON HCL 4 MG/2ML IJ SOLN: 4.0000 mg | Freq: Four times a day (QID) | INTRAMUSCULAR | Status: DC | PRN

## 2021-01-19 MED ORDER — ONDANSETRON HCL 4 MG/2ML IJ SOLN
4.0000 mg | Freq: Once | INTRAMUSCULAR | Status: AC
  Administered 2021-01-19: 4 mg via INTRAVENOUS
  Filled 2021-01-19: qty 2

## 2021-01-19 MED ORDER — MORPHINE SULFATE (PF) 2 MG/ML IV SOLN: 2.0000 mg | INTRAVENOUS | Status: DC | PRN

## 2021-01-19 MED ORDER — MUPIROCIN 2 % EX OINT
1.0000 "application " | TOPICAL_OINTMENT | Freq: Two times a day (BID) | CUTANEOUS | Status: DC
  Administered 2021-01-19: 1 via NASAL
  Filled 2021-01-19: qty 22

## 2021-01-19 MED ORDER — SODIUM CHLORIDE 0.9 % IV SOLN
1.0000 g | INTRAVENOUS | Status: DC
  Administered 2021-01-19 – 2021-01-20 (×2): 1 g via INTRAVENOUS
  Filled 2021-01-19 (×2): qty 10

## 2021-01-19 MED ORDER — FENTANYL CITRATE PF 50 MCG/ML IJ SOSY
25.0000 ug | PREFILLED_SYRINGE | Freq: Once | INTRAMUSCULAR | Status: AC
  Administered 2021-01-19: 25 ug via INTRAVENOUS
  Filled 2021-01-19: qty 1

## 2021-01-19 MED ORDER — ONDANSETRON HCL 4 MG PO TABS: 4.0000 mg | ORAL_TABLET | Freq: Four times a day (QID) | ORAL | Status: DC | PRN

## 2021-01-19 MED ORDER — ACETAMINOPHEN 325 MG PO TABS
650.0000 mg | ORAL_TABLET | Freq: Four times a day (QID) | ORAL | Status: DC | PRN
  Administered 2021-01-19: 18:00:00 650 mg via ORAL
  Filled 2021-01-19: qty 2

## 2021-01-19 MED ORDER — LACTATED RINGERS IV SOLN: INTRAVENOUS | Status: AC

## 2021-01-19 MED ORDER — FENTANYL CITRATE PF 50 MCG/ML IJ SOSY
50.0000 ug | PREFILLED_SYRINGE | Freq: Once | INTRAMUSCULAR | Status: AC
  Administered 2021-01-19: 50 ug via INTRAVENOUS
  Filled 2021-01-19: qty 1

## 2021-01-19 MED ORDER — POLYETHYLENE GLYCOL 3350 17 G PO PACK: 17.0000 g | PACK | Freq: Every day | ORAL | Status: DC | PRN

## 2021-01-19 MED ORDER — ACETAMINOPHEN 650 MG RE SUPP: 650.0000 mg | Freq: Four times a day (QID) | RECTAL | Status: DC | PRN

## 2021-01-19 MED ORDER — PNEUMOCOCCAL VAC POLYVALENT 25 MCG/0.5ML IJ INJ: 0.5000 mL | INJECTION | INTRAMUSCULAR | Status: DC

## 2021-01-19 NOTE — Progress Notes (Signed)
**Note De-identified Niguel Moure Obfuscation** EKG complete and placed in patient chart 

## 2021-01-19 NOTE — ED Triage Notes (Signed)
Pt to the ED with abdominal pain after being diagnosed with gallstones at [facility] yesterday.

## 2021-01-19 NOTE — Progress Notes (Signed)
[facility]  Patient with Gallstones and symptoms that are worsening and not controlled. Has bene to PCP and ED now for two days. Attempted/ offered po trial in ED versus admission for cholecystectomy tomorrow.  Wants to proceed with cholecystectomy tomorrow. Hospitalist admission, NPO midnight, will take over in the AM and surgery around 1045 tomorrow.  Will discuss with patient tomorrow AM.   Curlene Labrum, MD [facility] [address] [place], [place] [address] [phone] (office)

## 2021-01-19 NOTE — H&P (Signed)
History and Physical    [name]:[id number] DOB: [birth year] DOA: 01/19/2021  PCP: [name], MD   Patient coming from: Home  I have personally briefly reviewed patient's old medical records in [place]  Chief Complaint: Abdominal pain  HPI: [name] is a 76 y.o. female with no known medical history who presented to the ED with complaints of right upper quadrant abdominal/ flank pain over the past 2 months.  She initially had some urinary symptoms, for which she completed 2 courses of antibiotics, her symptoms persisted after the first course.  1 of those antibiotics was Keflex.  She reports over the past several days, her flank significantly worsened.  She had 1 episode of vomiting today.  No fevers no chills.  No diarrhea. She reports her last dose of antibiotic was 3 January.  She reports intermittent urinary incontinence, and some nocturia which is unusual.  With worsening of abdominal pain, she is unsure if her UTI symptoms have resolved.  Patient went to [facility] yesterday, she had a CT scan done, she was told she had 1 gallstone.  ED Course: Stable vitals.  WBC 8.1.  Right upper quadrant abdominal ultrasound shows cholelithiasis without acute cholecystitis, an 8 mm gallbladder polyp. EDP talked to Dr. Constance Haw, patient opted for surgical management tomorrow.  Plans for surgery tomorrow, n.p.o. midnight.    Review of Systems: As per HPI all other systems reviewed and negative.  Past Medical History:  Diagnosis Date   Distal radius fracture, right    mechanical fall   Hip fracture, left ([place])    2017   Hyperglycemia    Kidney stone    X 2   UTI (lower urinary tract infection)    06/2015    Past Surgical History:  Procedure Laterality Date   CARDIAC CATHETERIZATION  2005   [facility]-report in echart-not in epic   CERVICAL FUSION  2006   COLONOSCOPY     COLONOSCOPY N/A 08/19/2014   Procedure: COLONOSCOPY;  Surgeon: Rogene Houston, MD;   [facility];  Service: Endoscopy;  Laterality: N/A;  [place]  9/13   lt long and index   HEMORRHOID SURGERY     INTRAMEDULLARY (IM) NAIL INTERTROCHANTERIC Left 06/14/2015   Procedure: INTRAMEDULLARY (IM) NAIL INTERTROCHANTRIC;  Surgeon: Newt Minion, MD;  Location: [place];  Service: Orthopedics;  Laterality: Left;   OPEN REDUCTION INTERNAL FIXATION (ORIF) DISTAL RADIAL FRACTURE Right 06/25/2014   Procedure: OPEN REDUCTION INTERNAL FIXATION (ORIF) RIGHT  DISTAL RADIUS FRACTURE;  Surgeon: Leanora Cover, MD;  Location: [place];  Service: Orthopedics;  Laterality: Right;   TENOLYSIS Left 05/02/2012   Procedure: LEFT LONG TENOLYSIS CAPSULE RELEASES MANIPULATION OF IP/MP JOINTS;  Surgeon: Cammie Sickle., MD;  Location: [place];  Service: Orthopedics;  Laterality: Left;   TUBAL LIGATION       reports that she quit smoking about 34 years ago. Her smoking use included cigarettes. She has never used smokeless tobacco. She reports that she does not drink alcohol and does not use drugs.  Allergies  Allergen Reactions   Sulfa Antibiotics Rash   Penicillins Hives   Other Rash    walnut    Family History  Problem Relation Age of Onset   Cancer Father        cns   Diabetes Neg Hx    Heart disease Neg Hx    Stroke Neg Hx  Prior to Admission medications   Medication Sig Start Date End Date Taking? Authorizing Provider  aspirin EC 81 MG tablet Take 1 tablet (81 mg total) by mouth daily. 06/14/15  Yes Newt Minion, MD  meloxicam (MOBIC) 7.5 MG tablet TAKE 1 TABLET BY MOUTH EVERY DAY Patient taking differently: Take 7.5 mg by mouth daily. 12/24/19  Yes Persons, Bevely Palmer, [place]  POTASSIUM PO Take 1 tablet by mouth daily.   Yes [provider]  sennosides-docusate sodium (SENOKOT-S) 8.6-50 MG tablet Take 1 tablet by mouth as needed for constipation.   Yes [provider]  cephALEXin (KEFLEX) 500 MG capsule Take 1  capsule (500 mg total) by mouth 3 (three) times daily. Patient not taking: Reported on 01/19/2021 12/12/19   Persons, Bevely Palmer, [place]  predniSONE (DELTASONE) 10 MG tablet TAKE 2 TABLETS (20 MG TOTAL) BY MOUTH DAILY WITH BREAKFAST. Patient not taking: Reported on 01/19/2021 10/15/17   Newt Minion, MD  predniSONE (DELTASONE) 10 MG tablet Take 2 tablets (20 mg total) by mouth daily with breakfast. Patient not taking: Reported on 01/19/2021 12/23/18   Persons, Bevely Palmer, PA  vitamin C (ASCORBIC ACID) 500 MG tablet Take 1 tablet (500 mg total) by mouth daily. Patient not taking: Reported on 01/19/2021 06/25/14   Leanora Cover, MD    Physical Exam: Vitals:   01/19/21 1315 01/19/21 1330 01/19/21 1401 01/19/21 1500  BP:  119/71 118/71   Pulse: 66 68 70 69  Resp: 13 18 17 14   Temp:      TempSrc:      SpO2: 93% 94% 98% 94%  Weight:      Height:        Constitutional: NAD, calm, comfortable Vitals:   01/19/21 1315 01/19/21 1330 01/19/21 1401 01/19/21 1500  BP:  119/71 118/71   Pulse: 66 68 70 69  Resp: 13 18 17 14   Temp:      TempSrc:      SpO2: 93% 94% 98% 94%  Weight:      Height:       Eyes: PERRL, lids and conjunctivae normal ENMT: Mucous membranes are moist.  Neck: normal, supple, no masses, no thyromegaly Respiratory: clear to auscultation bilaterally, no wheezing, no crackles. Normal respiratory effort. No accessory muscle use.  Cardiovascular: Regular rate and rhythm, no murmurs / rubs / gallops. No extremity edema. 2+ pedal pulses.   Abdomen: no tenderness s/p fentanyl, no masses palpated. No hepatosplenomegaly. Bowel sounds positive.  Musculoskeletal: no clubbing / cyanosis. No joint deformity upper and lower extremities. Good ROM, no contractures. Normal muscle tone.  Skin: no rashes, lesions, ulcers. No induration Neurologic: No apparent cranial nerve abnormality, moving extremities spontaneously.  Psychiatric: Normal judgment and insight. Alert and oriented x 3. Normal mood.    Labs on Admission: I have personally reviewed following labs and imaging studies  CBC: Recent Labs  Lab 01/19/21 0945  WBC 8.1  NEUTROABS 6.6  HGB 14.6  HCT 43.6  MCV 91.4  PLT 578   Basic Metabolic Panel: Recent Labs  Lab 01/19/21 0945  NA 139  K 3.9  CL 107  CO2 24  GLUCOSE 120*  BUN 18  CREATININE 0.73  CALCIUM 9.2   GFR: Estimated Creatinine Clearance: 53.3 mL/min (by C-G formula based on SCr of 0.73 mg/dL). Liver Function Tests: Recent Labs  Lab 01/19/21 0945  AST 21  ALT 11  ALKPHOS 77  BILITOT 0.9  PROT 6.7  ALBUMIN 4.2   Recent Labs  Lab  01/19/21 0945  LIPASE 31   Urine analysis:    Component Value Date/Time   COLORURINE YELLOW 01/19/2021 1222   APPEARANCEUR HAZY (A) 01/19/2021 1222   LABSPEC 1.028 01/19/2021 1222   PHURINE 5.0 01/19/2021 1222   GLUCOSEU NEGATIVE 01/19/2021 1222   HGBUR SMALL (A) 01/19/2021 1222   BILIRUBINUR NEGATIVE 01/19/2021 1222   KETONESUR 20 (A) 01/19/2021 1222   PROTEINUR 30 (A) 01/19/2021 1222   NITRITE NEGATIVE 01/19/2021 1222   LEUKOCYTESUR MODERATE (A) 01/19/2021 1222    Radiological Exams on Admission: US Abdomen Limited  Result Date: 01/19/2021 CLINICAL DATA:  Abdominal pain.  Gallstone on recent CT. EXAM: ULTRASOUND ABDOMEN LIMITED RIGHT UPPER QUADRANT COMPARISON:  CT of 1 day prior [facility], report only FINDINGS: Gallbladder: Mobile gallstones at up to 9 mm. No wall thickening or pericholecystic fluid. Sonographic Murphy's sign was not elicited. Probable gallbladder polyp at 8 mm. Common bile duct: Diameter: Normal, 4 mm. Liver: No focal lesion identified. Within normal limits in parenchymal echogenicity. Portal vein is patent on color Doppler imaging with normal direction of blood flow towards the liver. Other: None. IMPRESSION: Cholelithiasis without acute cholecystitis. 8 mm gallbladder polyp. Per consensus criteria, follow-up ultrasound at 1 year is recommended. This recommendation follows ACR  consensus guidelines: White Paper of the ACR Incidental Findings Committee II on Gallbladder and Biliary Findings. J Am Coll Radiol 2013:;10:953-956. Electronically Signed   By: Abigail Miyamoto M.D.   On: 01/19/2021 11:14    EKG: None   Assessment/Plan Principal Problem:   Cholelithiasis   Cholelithiasis-right upper quadrant ultrasound shows cholelithiasis without cholecystitis and 8 mm gallbladder polyp.  She is afebrile without leukocytosis.  Rules out for sepsis. - EDP Dr. Constance Haw, will see in the morning, n.p.o. midnight, plans for surgery tomorrow. -IV morphine 2 mg as needed - Lr 75cc/hr x 15hrs - Pre-op EKG  Bacteriuria-reports completing 2 courses of antibiotics for UTI.  1 of those medications was Keflex.  She reports her last dose of antibiotic was 3 January.   At this time is hard to tell if she still symptomatic from her UTI, as she is still having intermittent urinary symptoms of nocturia and urge incontinence. -Obtain urine cultures - IV ceftriaxone 1 g daily  DVT prophylaxis: SCDs Code Status: Full code Family Communication: None at bedside Disposition Plan: ~ 2 days Consults called: Gen Surg Admission status: Inpt med-surg I certify that at the point of admission it is my clinical judgment that the patient will require inpatient hospital care spanning beyond 2 midnights from the point of admission due to high intensity of service, high risk for further deterioration and high frequency of surveillance required.    Bethena Roys MD [facility]  01/19/2021, 4:20 PM

## 2021-01-19 NOTE — ED Provider Notes (Signed)
[facility] EMERGENCY DEPARTMENT Provider Note   CSN: [id number] Arrival date & time: 01/19/21  0931     History  Chief Complaint  Patient presents with   Abdominal Pain    [name] is a 76 y.o. female.   Abdominal Pain Associated symptoms: nausea and vomiting   Associated symptoms: no chest pain, no diarrhea, no dysuria and no shortness of breath        [name] is a 76 y.o. female with past medical history of kidney stones, frequent UTIs and anemia who presents to the Emergency Department complaining of diffuse abdominal pain waxing and waning x2 months.  Pain has gradually worsened over several days.  She has been seen by her PCP in [place] and a CT of the abdomen and pelvis was performed yesterday at [facility].  Patient was informed that she had a gallstone.  She was given pain medication which she states has not controlled her pain.  She has had persistent nausea and 1 episode of vomiting this morning.  She contacted her PCPs office this morning and was advised to come here for further evaluation.  She denies any fever, chills, diarrhea, constipation, or dysuria no aggravating or alleviating factors.   Home Medications Prior to Admission medications   Medication Sig Start Date End Date Taking? Authorizing Provider  aspirin EC 81 MG tablet Take 1 tablet (81 mg total) by mouth daily. 06/14/15   Newt Minion, MD  cephALEXin (KEFLEX) 500 MG capsule Take 1 capsule (500 mg total) by mouth 3 (three) times daily. 12/12/19   Persons, Bevely Palmer, PA  meloxicam (MOBIC) 7.5 MG tablet TAKE 1 TABLET BY MOUTH EVERY DAY 12/24/19   Persons, Bevely Palmer, PA  predniSONE (DELTASONE) 10 MG tablet TAKE 2 TABLETS (20 MG TOTAL) BY MOUTH DAILY WITH BREAKFAST. 10/15/17   Newt Minion, MD  predniSONE (DELTASONE) 10 MG tablet Take 2 tablets (20 mg total) by mouth daily with breakfast. 12/23/18   Persons, Bevely Palmer, PA  sennosides-docusate sodium (SENOKOT-S) 8.6-50 MG tablet Take 1 tablet by  mouth as needed for constipation.    [provider]  vitamin C (ASCORBIC ACID) 500 MG tablet Take 1 tablet (500 mg total) by mouth daily. 06/25/14   Leanora Cover, MD      Allergies    Sulfa antibiotics, Penicillins, and Other    Review of Systems   Review of Systems  Respiratory:  Negative for shortness of breath.   Cardiovascular:  Negative for chest pain.  Gastrointestinal:  Positive for abdominal pain, nausea and vomiting. Negative for diarrhea.  Genitourinary:  Negative for difficulty urinating, dysuria and flank pain.  Musculoskeletal:  Positive for back pain.  Skin:  Negative for color change and rash.  All other systems reviewed and are negative.  Physical Exam Updated Vital Signs BP (!) 126/103    Pulse 78    Temp 97.9 F (36.6 C) (Oral)    Resp 18    Ht 5\' 2"  (1.575 m)    Wt 61.7 kg    SpO2 98%    BMI 24.88 kg/m  Physical Exam Vitals and nursing note reviewed.  Constitutional:      General: She is not in acute distress. HENT:     Mouth/Throat:     Mouth: Mucous membranes are moist.  Cardiovascular:     Rate and Rhythm: Normal rate and regular rhythm.  Pulmonary:     Effort: Pulmonary effort is normal.  Chest:  Chest wall: No tenderness.  Abdominal:     General: There is no distension.     Palpations: Abdomen is soft. There is no mass.     Tenderness: There is generalized abdominal tenderness. There is no right CVA tenderness, left CVA tenderness or guarding.     Comments: Diffuse, mild tenderness to palpation of the entire abdomen.  No guarding or rebound tenderness.  Musculoskeletal:        General: Normal range of motion.     Right lower leg: No edema.     Left lower leg: No edema.  Skin:    General: Skin is warm.     Capillary Refill: Capillary refill takes less than 2 seconds.  Neurological:     General: No focal deficit present.     Mental Status: She is alert.     Sensory: No sensory deficit.     Motor: No weakness.    ED Results /  Procedures / Treatments   Labs (all labs ordered are listed, but only abnormal results are displayed) Labs Reviewed  COMPREHENSIVE METABOLIC PANEL - Abnormal; Notable for the following components:      Result Value   Glucose, Bld 120 (*)    All other components within normal limits  URINALYSIS, ROUTINE W REFLEX MICROSCOPIC - Abnormal; Notable for the following components:   APPearance HAZY (*)    Hgb urine dipstick SMALL (*)    Ketones, ur 20 (*)    Protein, ur 30 (*)    Leukocytes,Ua MODERATE (*)    Bacteria, UA RARE (*)    All other components within normal limits  RESP PANEL BY RT-PCR (FLU A&B, COVID) ARPGX2  URINE CULTURE  SURGICAL PCR SCREEN  CBC WITH DIFFERENTIAL/PLATELET  LIPASE, BLOOD  BASIC METABOLIC PANEL  CBC    EKG None  Radiology US Abdomen Limited  Result Date: 01/19/2021 CLINICAL DATA:  Abdominal pain.  Gallstone on recent CT. EXAM: ULTRASOUND ABDOMEN LIMITED RIGHT UPPER QUADRANT COMPARISON:  CT of 1 day prior from [facility] rocking ham, report only FINDINGS: Gallbladder: Mobile gallstones at up to 9 mm. No wall thickening or pericholecystic fluid. Sonographic Murphy's sign was not elicited. Probable gallbladder polyp at 8 mm. Common bile duct: Diameter: Normal, 4 mm. Liver: No focal lesion identified. Within normal limits in parenchymal echogenicity. Portal vein is patent on color Doppler imaging with normal direction of blood flow towards the liver. Other: None. IMPRESSION: Cholelithiasis without acute cholecystitis. 8 mm gallbladder polyp. Per consensus criteria, follow-up ultrasound at 1 year is recommended. This recommendation follows ACR consensus guidelines: White Paper of the ACR Incidental Findings Committee II on Gallbladder and Biliary Findings. J Am Coll Radiol 2013:;10:953-956. Electronically Signed   By: Abigail Miyamoto M.D.   On: 01/19/2021 11:14    Procedures Procedures    Medications Ordered in ED Medications - No data to display  ED Course/ Medical  Decision Making/ A&P                           Medical Decision Making  This patient presents to the ED for concern of Abdominal pain, nausea, vomiting, this involves an extensive number of treatment options, and is a complaint that carries with it a high risk of complications and morbidity.  The differential diagnosis includes Acute cholecystitis, acute appendicitis, diverticulitis, viral process, pyelonephritis   Co morbidities that complicate the patient evaluation  History of recurrent UTIs and kidney stone, Age   Additional history  obtained:  Additional history obtained from Patient friend at bedside External records from outside source obtained and reviewed including CT abdomen pelvis from [facility] dated 01/18/2021   Lab Tests:  I Ordered, and personally interpreted labs.  The pertinent results include:  No leukocytosis, no electrolyte derangement, lipase unremarkable   Imaging Studies ordered:  I ordered imaging studies including Limited ultrasound of the upper abdomen I independently visualized and interpreted imaging which showed Multiple mobile gallstones with the largest at 9 mm And gallbladder polyp.  No pericholecystic fluid I agree with the radiologist interpretation    Medicines ordered and prescription drug management:  I ordered medication including IV pain medication And antiemetic for Abdominal pain and nausea  Reevaluation of the patient after these medicines showed that the patient improved after multiple doses I have reviewed the patients home medicines and have made adjustments as needed   Test Considered:  Ultrasound abdomen, repeat CT abdomen pelvis  Critical Interventions:  None   Consultations Obtained:  I requested consultation with the General surgery, Dr. Constance Haw  and discussed lab and imaging findings as well as pertinent plan - they recommend: admission for uncontrolled pain and plan for cholecystectomy tomorrow. Discussed  findings and care plan with [facility] hospitalist, Dr. Arlyce Dice who agrees to admit.      Reevaluation:  After the interventions noted above, I reevaluated the patient and found that they have improved   Social Determinants of Health:  none   Dispostion:  After consideration of the diagnostic results and the patients response to treatment, I feel that the patent would benefit from hospital admission with plan for cholecystectomy.  Dr. Constance Haw request hospitalist admission and she will take over care tomorrow.           Final Clinical Impression(s) / ED Diagnoses Final diagnoses:  Calculus of gallbladder without cholecystitis without obstruction    Rx / DC Orders ED Discharge Orders     None         Bufford Lope 01/19/21 1857    Milton Ferguson, MD 01/22/21 [phone]

## 2021-01-20 ENCOUNTER — Encounter (HOSPITAL_COMMUNITY): Payer: Self-pay | Admitting: Internal Medicine

## 2021-01-20 ENCOUNTER — Inpatient Hospital Stay (HOSPITAL_COMMUNITY): Payer: Medicare Other | Admitting: Certified Registered Nurse Anesthetist

## 2021-01-20 ENCOUNTER — Encounter (HOSPITAL_COMMUNITY)
Admission: EM | Disposition: A | Discharge status: Home / Self Care | Payer: Self-pay | Source: Home / Self Care | Attending: General Surgery

## 2021-01-20 DIAGNOSIS — K8020 Calculus of gallbladder without cholecystitis without obstruction: Secondary | ICD-10-CM | POA: Diagnosis not present

## 2021-01-20 HISTORY — PX: CHOLECYSTECTOMY: SHX55

## 2021-01-20 LAB — CBC
HCT: 39.9 % (ref 36.0–46.0)
Hemoglobin: 12.9 g/dL (ref 12.0–15.0)
MCH: 29.5 pg (ref 26.0–34.0)
MCHC: 32.3 g/dL (ref 30.0–36.0)
MCV: 91.3 fL (ref 80.0–100.0)
Platelets: 209 10*3/uL (ref 150–400)
RBC: 4.37 MIL/uL (ref 3.87–5.11)
RDW: 13.3 % (ref 11.5–15.5)
WBC: 4.6 10*3/uL (ref 4.0–10.5)
nRBC: 0 % (ref 0.0–0.2)

## 2021-01-20 LAB — BASIC METABOLIC PANEL
Anion gap: 3 — ABNORMAL LOW (ref 5–15)
BUN: 13 mg/dL (ref 8–23)
CO2: 29 mmol/L (ref 22–32)
Calcium: 8.9 mg/dL (ref 8.9–10.3)
Chloride: 110 mmol/L (ref 98–111)
Creatinine, Ser: 0.71 mg/dL (ref 0.44–1.00)
GFR, Estimated: >60 mL/min (ref >60)
Glucose, Bld: 93 mg/dL (ref 70–99)
Potassium: 4.4 mmol/L (ref 3.5–5.1)
Sodium: 142 mmol/L (ref 135–145)

## 2021-01-20 LAB — URINE CULTURE: Culture: NO GROWTH

## 2021-01-20 LAB — GLUCOSE, CAPILLARY: Glucose-Capillary: 116 mg/dL — ABNORMAL HIGH (ref 70–99)

## 2021-01-20 SURGERY — LAPAROSCOPIC CHOLECYSTECTOMY
Anesthesia: General | Site: Abdomen

## 2021-01-20 MED ORDER — ONDANSETRON HCL 4 MG/2ML IJ SOLN
INTRAMUSCULAR | Status: AC
  Filled 2021-01-20: qty 4

## 2021-01-20 MED ORDER — ORAL CARE MOUTH RINSE: 15.0000 mL | Freq: Once | OROMUCOSAL | Status: DC

## 2021-01-20 MED ORDER — LIDOCAINE HCL (CARDIAC) PF 100 MG/5ML IV SOSY
PREFILLED_SYRINGE | INTRAVENOUS | Status: DC | PRN
  Administered 2021-01-20: 60 mg via INTRATRACHEAL

## 2021-01-20 MED ORDER — PHENYLEPHRINE 40 MCG/ML (10ML) SYRINGE FOR IV PUSH (FOR BLOOD PRESSURE SUPPORT)
PREFILLED_SYRINGE | INTRAVENOUS | Status: AC
  Filled 2021-01-20: qty 10

## 2021-01-20 MED ORDER — SIMETHICONE 80 MG PO CHEW
80.0000 mg | CHEWABLE_TABLET | Freq: Four times a day (QID) | ORAL | Status: DC | PRN
  Administered 2021-01-21 (×3): 80 mg via ORAL
  Filled 2021-01-20 (×3): qty 1

## 2021-01-20 MED ORDER — ONDANSETRON HCL 4 MG/2ML IJ SOLN
INTRAMUSCULAR | Status: AC
  Filled 2021-01-20: qty 2

## 2021-01-20 MED ORDER — CHLORHEXIDINE GLUCONATE 0.12 % MT SOLN: 15.0000 mL | Freq: Once | OROMUCOSAL | Status: DC

## 2021-01-20 MED ORDER — ONDANSETRON HCL 4 MG/2ML IJ SOLN: 4.0000 mg | Freq: Once | INTRAMUSCULAR | Status: DC | PRN

## 2021-01-20 MED ORDER — SIMETHICONE 80 MG PO CHEW
80.0000 mg | CHEWABLE_TABLET | Freq: Once | ORAL | Status: AC
  Administered 2021-01-20: 80 mg via ORAL
  Filled 2021-01-20: qty 1

## 2021-01-20 MED ORDER — KETOROLAC TROMETHAMINE 30 MG/ML IJ SOLN
INTRAMUSCULAR | Status: AC
  Filled 2021-01-20: qty 1

## 2021-01-20 MED ORDER — LACTATED RINGERS IV SOLN: INTRAVENOUS | Status: DC

## 2021-01-20 MED ORDER — ROCURONIUM BROMIDE 10 MG/ML (PF) SYRINGE
PREFILLED_SYRINGE | INTRAVENOUS | Status: DC | PRN
Start: 2021-01-20 — End: 2021-01-20
  Administered 2021-01-20: 60 mg via INTRAVENOUS

## 2021-01-20 MED ORDER — EPHEDRINE SULFATE 50 MG/ML IJ SOLN
INTRAMUSCULAR | Status: DC | PRN
  Administered 2021-01-20: 5 mg via INTRAVENOUS

## 2021-01-20 MED ORDER — SODIUM CHLORIDE 0.9 % IR SOLN
Status: DC | PRN
  Administered 2021-01-20: 1000 mL

## 2021-01-20 MED ORDER — SEVOFLURANE IN SOLN
RESPIRATORY_TRACT | Status: AC
  Filled 2021-01-20: qty 250

## 2021-01-20 MED ORDER — HEMOSTATIC AGENTS (NO CHARGE) OPTIME
TOPICAL | Status: DC | PRN
  Administered 2021-01-20: 1 via TOPICAL

## 2021-01-20 MED ORDER — FENTANYL CITRATE (PF) 250 MCG/5ML IJ SOLN
INTRAMUSCULAR | Status: AC
  Filled 2021-01-20: qty 5

## 2021-01-20 MED ORDER — BUPIVACAINE HCL (PF) 0.5 % IJ SOLN
INTRAMUSCULAR | Status: DC | PRN
  Administered 2021-01-20: 20 mL

## 2021-01-20 MED ORDER — ONDANSETRON HCL 4 MG/2ML IJ SOLN
INTRAMUSCULAR | Status: DC | PRN
  Administered 2021-01-20: 4 mg via INTRAVENOUS

## 2021-01-20 MED ORDER — PHENYLEPHRINE HCL (PRESSORS) 10 MG/ML IV SOLN
INTRAVENOUS | Status: DC | PRN
  Administered 2021-01-20 (×2): 80 ug via INTRAVENOUS

## 2021-01-20 MED ORDER — EPHEDRINE 5 MG/ML INJ
INTRAVENOUS | Status: AC
  Filled 2021-01-20: qty 5

## 2021-01-20 MED ORDER — CYCLOBENZAPRINE HCL 10 MG PO TABS
5.0000 mg | ORAL_TABLET | Freq: Three times a day (TID) | ORAL | Status: DC | PRN
  Administered 2021-01-20 – 2021-01-21 (×2): 5 mg via ORAL
  Filled 2021-01-20 (×2): qty 1

## 2021-01-20 MED ORDER — SUCCINYLCHOLINE CHLORIDE 200 MG/10ML IV SOSY
PREFILLED_SYRINGE | INTRAVENOUS | Status: AC
  Filled 2021-01-20: qty 10

## 2021-01-20 MED ORDER — LIDOCAINE HCL (PF) 2 % IJ SOLN
INTRAMUSCULAR | Status: AC
  Filled 2021-01-20: qty 5

## 2021-01-20 MED ORDER — CHLORHEXIDINE GLUCONATE CLOTH 2 % EX PADS
6.0000 | MEDICATED_PAD | Freq: Once | CUTANEOUS | Status: AC
Start: 2021-01-20 — End: 2021-01-20
  Administered 2021-01-20: 6 via TOPICAL

## 2021-01-20 MED ORDER — MORPHINE SULFATE (PF) 2 MG/ML IV SOLN
2.0000 mg | INTRAVENOUS | Status: DC | PRN
  Administered 2021-01-20 (×2): 2 mg via INTRAVENOUS
  Filled 2021-01-20 (×3): qty 1

## 2021-01-20 MED ORDER — SODIUM CHLORIDE 0.9 % IV SOLN
2.0000 g | INTRAVENOUS | Status: AC
  Administered 2021-01-20: 2 g via INTRAVENOUS
  Filled 2021-01-20: qty 20

## 2021-01-20 MED ORDER — BUPIVACAINE HCL (PF) 0.5 % IJ SOLN
INTRAMUSCULAR | Status: AC
  Filled 2021-01-20: qty 30

## 2021-01-20 MED ORDER — ROCURONIUM BROMIDE 10 MG/ML (PF) SYRINGE
PREFILLED_SYRINGE | INTRAVENOUS | Status: AC
  Filled 2021-01-20: qty 10

## 2021-01-20 MED ORDER — KETOROLAC TROMETHAMINE 30 MG/ML IJ SOLN
INTRAMUSCULAR | Status: DC | PRN
  Administered 2021-01-20: 30 mg via INTRAVENOUS

## 2021-01-20 MED ORDER — LIDOCAINE HCL (PF) 2 % IJ SOLN
INTRAMUSCULAR | Status: AC
  Filled 2021-01-20: qty 10

## 2021-01-20 MED ORDER — FENTANYL CITRATE PF 50 MCG/ML IJ SOSY: 25.0000 ug | PREFILLED_SYRINGE | INTRAMUSCULAR | Status: DC | PRN

## 2021-01-20 MED ORDER — SUGAMMADEX SODIUM 500 MG/5ML IV SOLN
INTRAVENOUS | Status: DC | PRN
  Administered 2021-01-20: 300 mg via INTRAVENOUS

## 2021-01-20 MED ORDER — PROPOFOL 10 MG/ML IV BOLUS
INTRAVENOUS | Status: AC
  Filled 2021-01-20: qty 20

## 2021-01-20 MED ORDER — FENTANYL CITRATE (PF) 100 MCG/2ML IJ SOLN
INTRAMUSCULAR | Status: DC | PRN
  Administered 2021-01-20: 50 ug via INTRAVENOUS
  Administered 2021-01-20: 100 ug via INTRAVENOUS

## 2021-01-20 MED ORDER — PROPOFOL 10 MG/ML IV BOLUS
INTRAVENOUS | Status: DC | PRN
  Administered 2021-01-20: 150 mg via INTRAVENOUS

## 2021-01-20 MED ORDER — OXYCODONE HCL 5 MG PO TABS
5.0000 mg | ORAL_TABLET | ORAL | Status: DC | PRN
Start: 2021-01-20 — End: 2021-01-21
  Administered 2021-01-20 – 2021-01-21 (×4): 5 mg via ORAL
  Filled 2021-01-20 (×4): qty 1

## 2021-01-20 SURGICAL SUPPLY — 41 items
ADH SKN CLS APL DERMABOND .7 (GAUZE/BANDAGES/DRESSINGS) ×1
APL PRP STRL LF DISP 70% ISPRP (MISCELLANEOUS) ×1
APPLIER CLIP ROT 10 11.4 M/L (STAPLE) ×2
APR CLP MED LRG 11.4X10 (STAPLE) ×1
BAG RETRIEVAL 10 (BASKET) ×1
BLADE SURG 15 STRL LF DISP TIS (BLADE) ×1 IMPLANT
BLADE SURG 15 STRL SS (BLADE) ×2
CHLORAPREP W/TINT 26 (MISCELLANEOUS) ×2 IMPLANT
CLIP APPLIE ROT 10 11.4 M/L (STAPLE) ×1 IMPLANT
CLOTH BEACON ORANGE TIMEOUT ST (SAFETY) ×2 IMPLANT
COVER LIGHT HANDLE STERIS (MISCELLANEOUS) ×4 IMPLANT
DECANTER SPIKE VIAL GLASS SM (MISCELLANEOUS) ×2 IMPLANT
DERMABOND ADVANCED (GAUZE/BANDAGES/DRESSINGS) ×1
DERMABOND ADVANCED .7 DNX12 (GAUZE/BANDAGES/DRESSINGS) ×1 IMPLANT
ELECT REM PT RETURN 9FT ADLT (ELECTROSURGICAL) ×2
ELECTRODE REM PT RTRN 9FT ADLT (ELECTROSURGICAL) ×1 IMPLANT
GAUZE 4X4 16PLY ~~LOC~~+RFID DBL (SPONGE) ×2 IMPLANT
GLOVE SURG ENC MOIS LTX SZ6.5 (GLOVE) ×2 IMPLANT
GLOVE SURG UNDER POLY LF SZ7 (GLOVE) ×8 IMPLANT
GOWN STRL REUS W/TWL LRG LVL3 (GOWN DISPOSABLE) ×6 IMPLANT
HEMOSTAT SNOW SURGICEL 2X4 (HEMOSTASIS) ×2 IMPLANT
INST SET LAPROSCOPIC AP (KITS) ×2 IMPLANT
KIT TURNOVER KIT A (KITS) ×2 IMPLANT
MANIFOLD NEPTUNE II (INSTRUMENTS) ×2 IMPLANT
NDL INSUFFLATION 14GA 120MM (NEEDLE) ×1 IMPLANT
NEEDLE INSUFFLATION 14GA 120MM (NEEDLE) ×2 IMPLANT
NS IRRIG 1000ML POUR BTL (IV SOLUTION) ×2 IMPLANT
PACK LAP CHOLE LZT030E (CUSTOM PROCEDURE TRAY) ×2 IMPLANT
PAD ARMBOARD 7.5X6 YLW CONV (MISCELLANEOUS) ×2 IMPLANT
SET BASIN LINEN APH (SET/KITS/TRAYS/PACK) ×2 IMPLANT
SET TUBE SMOKE EVAC HIGH FLOW (TUBING) ×2 IMPLANT
SLEEVE ENDOPATH XCEL 5M (ENDOMECHANICALS) ×2 IMPLANT
SUT MNCRL AB 4-0 PS2 18 (SUTURE) ×4 IMPLANT
SUT VICRYL 0 UR6 27IN ABS (SUTURE) ×2 IMPLANT
SYS BAG RETRIEVAL 10MM (BASKET) ×1
SYSTEM BAG RETRIEVAL 10MM (BASKET) ×1 IMPLANT
TROCAR ENDO BLADELESS 11MM (ENDOMECHANICALS) ×2 IMPLANT
TROCAR XCEL NON-BLD 5MMX100MML (ENDOMECHANICALS) ×2 IMPLANT
TROCAR XCEL UNIV SLVE 11M 100M (ENDOMECHANICALS) ×2 IMPLANT
TUBE CONNECTING 12X1/4 (SUCTIONS) ×2 IMPLANT
WARMER LAPAROSCOPE (MISCELLANEOUS) ×2 IMPLANT

## 2021-01-20 NOTE — Progress Notes (Signed)
[facility]  Updated patient and husband over phone.  Curlene Labrum, MD [facility] [address] [place], [place] [address] [phone] (office)

## 2021-01-20 NOTE — Anesthesia Preprocedure Evaluation (Signed)
Anesthesia Evaluation  Patient identified by MRN, date of birth, ID band Patient awake    Reviewed: Allergy & Precautions, H&P , NPO status , Patient's Chart, lab work & pertinent test results, reviewed documented beta blocker date and time   Airway Mallampati: II  TM Distance: >3 FB Neck ROM: full    Dental no notable dental hx.    Pulmonary neg pulmonary ROS, former smoker,    Pulmonary exam normal breath sounds clear to auscultation       Cardiovascular Exercise Tolerance: Good negative cardio ROS   Rhythm:regular Rate:Normal     Neuro/Psych negative neurological ROS  negative psych ROS   GI/Hepatic negative GI ROS, Neg liver ROS,   Endo/Other  negative endocrine ROS  Renal/GU negative Renal ROS  negative genitourinary   Musculoskeletal   Abdominal   Peds  Hematology  (+) Blood dyscrasia, anemia ,   Anesthesia Other Findings   Reproductive/Obstetrics negative OB ROS                             Anesthesia Physical Anesthesia Plan  ASA: 2  Anesthesia Plan: General and General ETT   Post-op Pain Management:    Induction:   PONV Risk Score and Plan: Ondansetron  Airway Management Planned:   Additional Equipment:   Intra-op Plan:   Post-operative Plan:   Informed Consent: I have reviewed the patients History and Physical, chart, labs and discussed the procedure including the risks, benefits and alternatives for the proposed anesthesia with the patient or authorized representative who has indicated his/her understanding and acceptance.     Dental Advisory Given  Plan Discussed with: CRNA  Anesthesia Plan Comments:         Anesthesia Quick Evaluation

## 2021-01-20 NOTE — Anesthesia Postprocedure Evaluation (Signed)
Anesthesia Post Note  Patient: [name]  Procedure(s) Performed: LAPAROSCOPIC CHOLECYSTECTOMY (Abdomen)  Patient location during evaluation: Phase II Anesthesia Type: General Level of consciousness: awake Pain management: pain level controlled Vital Signs Assessment: post-procedure vital signs reviewed and stable Respiratory status: spontaneous breathing and respiratory function stable Cardiovascular status: blood pressure returned to baseline and stable Postop Assessment: no headache and no apparent nausea or vomiting Anesthetic complications: no Comments: Late entry   No notable events documented.   Last Vitals:  Vitals:   01/20/21 1230 01/20/21 1259  BP: 119/61 121/70  Pulse: 80 73  Resp: 17 16  Temp: 36.7 C   SpO2: 97% 100%    Last Pain:  Vitals:   01/20/21 1259  TempSrc:   PainSc: 10-Worst pain ever                 Louann Sjogren

## 2021-01-20 NOTE — Consult Note (Signed)
[facility] Consult  Reason for Consult: Symptomatic gallstones with intractable nausea  Referring Physician: ED   Chief Complaint   Abdominal Pain     HPI: [name] is a 76 y.o. female with gallstones. She has had some RUQ pain with eating for 2 years but in the last week this has became severe and she had a CT scan obtained by her PCP that demonstrated stones. She says that her pain is in the RUQ with eating anything now and she has nausea and vomiting. Attempt at po trial and she still had pain and was admitted for pain control and cholecystectomy. She has normal LFTs and was being treated for UTI by her PCP. Her UA here showed no WBC but she had been on antibiotics.   Past Medical History:  Diagnosis Date   Distal radius fracture, right    mechanical fall   Hip fracture, left ([place])    2017   Hyperglycemia    Kidney stone    X 2   UTI (lower urinary tract infection)    06/2015    Past Surgical History:  Procedure Laterality Date   CARDIAC CATHETERIZATION  2005   [facility]-report in echart-not in epic   CERVICAL FUSION  2006   COLONOSCOPY     COLONOSCOPY N/A 08/19/2014   Procedure: COLONOSCOPY;  Surgeon: Rogene Houston, MD;  [facility];  Service: Endoscopy;  Laterality: N/A;  [place]  9/13   lt long and index   HEMORRHOID SURGERY     INTRAMEDULLARY (IM) NAIL INTERTROCHANTERIC Left 06/14/2015   Procedure: INTRAMEDULLARY (IM) NAIL INTERTROCHANTRIC;  Surgeon: Newt Minion, MD;  Location: [place];  Service: Orthopedics;  Laterality: Left;   OPEN REDUCTION INTERNAL FIXATION (ORIF) DISTAL RADIAL FRACTURE Right 06/25/2014   Procedure: OPEN REDUCTION INTERNAL FIXATION (ORIF) RIGHT  DISTAL RADIUS FRACTURE;  Surgeon: Leanora Cover, MD;  Location: [place];  Service: Orthopedics;  Laterality: Right;   TENOLYSIS Left 05/02/2012   Procedure: LEFT LONG TENOLYSIS CAPSULE RELEASES MANIPULATION OF IP/MP JOINTS;  Surgeon:  Cammie Sickle., MD;  Location: [place];  Service: Orthopedics;  Laterality: Left;   TUBAL LIGATION      Family History  Problem Relation Age of Onset   Cancer Father        cns   Diabetes Neg Hx    Heart disease Neg Hx    Stroke Neg Hx     Social History   Tobacco Use   Smoking status: Former    Types: Cigarettes    Quit date: 10/03/1986    Years since quitting: 34.3   Smokeless tobacco: Never  Vaping Use   Vaping Use: Never used  Substance Use Topics   Alcohol use: No   Drug use: No    Medications: I have reviewed the patient's current medications. Prior to Admission:  Medications Prior to Admission  Medication Sig Dispense Refill Last Dose   aspirin EC 81 MG tablet Take 1 tablet (81 mg total) by mouth daily. 30 tablet 1 01/18/2021   meloxicam (MOBIC) 7.5 MG tablet TAKE 1 TABLET BY MOUTH EVERY DAY (Patient taking differently: Take 7.5 mg by mouth daily.) 30 tablet 0 01/18/2021   POTASSIUM PO Take 1 tablet by mouth daily.   01/18/2021   sennosides-docusate sodium (SENOKOT-S) 8.6-50 MG tablet Take 1 tablet by mouth as needed for constipation.   Past Month   cephALEXin (KEFLEX) 500 MG capsule  Take 1 capsule (500 mg total) by mouth 3 (three) times daily. (Patient not taking: Reported on 01/19/2021) 30 capsule 0 Completed Course   predniSONE (DELTASONE) 10 MG tablet TAKE 2 TABLETS (20 MG TOTAL) BY MOUTH DAILY WITH BREAKFAST. (Patient not taking: Reported on 01/19/2021) 60 tablet 0 Completed Course   predniSONE (DELTASONE) 10 MG tablet Take 2 tablets (20 mg total) by mouth daily with breakfast. (Patient not taking: Reported on 01/19/2021) 30 tablet 1 Completed Course   vitamin C (ASCORBIC ACID) 500 MG tablet Take 1 tablet (500 mg total) by mouth daily. (Patient not taking: Reported on 01/19/2021) 30 tablet 0 Not Taking   Scheduled:  pneumococcal 23 valent vaccine  0.5 mL Intramuscular Tomorrow-1000   Continuous:  [MAR Hold] cefTRIAXone (ROCEPHIN)  IV Stopped  (01/19/21 1832)   cefTRIAXone (ROCEPHIN)  IV     PRN:[MAR Hold] acetaminophen **OR** [MAR Hold] acetaminophen, [MAR Hold]  morphine injection, [MAR Hold] ondansetron **OR** [MAR Hold] ondansetron (ZOFRAN) IV, [MAR Hold] polyethylene glycol  Allergies  Allergen Reactions   Sulfa Antibiotics Rash   Penicillins Hives   Other Rash    walnut     ROS:  A comprehensive review of systems was negative except for: Gastrointestinal: positive for abdominal pain, nausea, and vomiting  Blood pressure 125/67, pulse 62, temperature 97.8 F (36.6 C), temperature source Oral, resp. rate 18, height 5\' 2"  (1.575 m), weight 61.7 kg, SpO2 95 %. Physical Exam Vitals reviewed.  Constitutional:      Appearance: She is well-developed.  HENT:     Head: Normocephalic.  Cardiovascular:     Rate and Rhythm: Normal rate and regular rhythm.  Pulmonary:     Effort: Pulmonary effort is normal.     Breath sounds: Normal breath sounds.  Abdominal:     General: There is no distension.     Palpations: Abdomen is soft.     Tenderness: There is abdominal tenderness in the right upper quadrant. There is no guarding. Negative signs include Murphy's sign.  Skin:    General: Skin is warm.  Neurological:     General: No focal deficit present.     Mental Status: She is alert and oriented to person, place, and time.  Psychiatric:        Mood and Affect: Mood normal.        Behavior: Behavior normal.    Results: Results for orders placed or performed during the hospital encounter of 01/19/21 (from the past 48 hour(s))  CBC with Differential     Status: None   Collection Time: 01/19/21  9:45 AM  Result Value Ref Range   WBC 8.1 4.0 - 10.5 K/uL   RBC 4.77 3.87 - 5.11 MIL/uL   Hemoglobin 14.6 12.0 - 15.0 g/dL   HCT 43.6 36.0 - 46.0 %   MCV 91.4 80.0 - 100.0 fL   MCH 30.6 26.0 - 34.0 pg   MCHC 33.5 30.0 - 36.0 g/dL   RDW 13.4 11.5 - 15.5 %   Platelets 243 150 - 400 K/uL   nRBC 0.0 0.0 - 0.2 %   Neutrophils  Relative % 82 %   Neutro Abs 6.6 1.7 - 7.7 K/uL   Lymphocytes Relative 10 %   Lymphs Abs 0.8 0.7 - 4.0 K/uL   Monocytes Relative 8 %   Monocytes Absolute 0.6 0.1 - 1.0 K/uL   Eosinophils Relative 0 %   Eosinophils Absolute 0.0 0.0 - 0.5 K/uL   Basophils Relative 0 %   Basophils Absolute  0.0 0.0 - 0.1 K/uL   Immature Granulocytes 0 %   Abs Immature Granulocytes 0.03 0.00 - 0.07 K/uL    Comment: Performed at [facility], [address], [place], [place] [address]  Comprehensive metabolic panel     Status: Abnormal   Collection Time: 01/19/21  9:45 AM  Result Value Ref Range   Sodium 139 135 - 145 mmol/L   Potassium 3.9 3.5 - 5.1 mmol/L   Chloride 107 98 - 111 mmol/L   CO2 24 22 - 32 mmol/L   Glucose, Bld 120 (H) 70 - 99 mg/dL    Comment: Glucose reference range applies only to samples taken after fasting for at least 8 hours.   BUN 18 8 - 23 mg/dL   Creatinine, Ser 0.73 0.44 - 1.00 mg/dL   Calcium 9.2 8.9 - 10.3 mg/dL   Total Protein 6.7 6.5 - 8.1 g/dL   Albumin 4.2 3.5 - 5.0 g/dL   AST 21 15 - 41 U/L   ALT 11 0 - 44 U/L   Alkaline Phosphatase 77 38 - 126 U/L   Total Bilirubin 0.9 0.3 - 1.2 mg/dL   GFR, Estimated >60 >60 mL/min    Comment: (NOTE) Calculated using the CKD-EPI Creatinine Equation (2021)    Anion gap 8 5 - 15    Comment: Performed at [facility], [address] [place], [place] [address]  Lipase, blood     Status: None   Collection Time: 01/19/21  9:45 AM  Result Value Ref Range   Lipase 31 11 - 51 U/L    Comment: Performed at [facility], [address], [place], [place] [address]  Urinalysis, Routine w reflex microscopic Urine, Clean Catch     Status: Abnormal   Collection Time: 01/19/21 12:22 PM  Result Value Ref Range   Color, Urine YELLOW YELLOW   APPearance HAZY (A) CLEAR   Specific Gravity, Urine 1.028 1.005 - 1.030   pH 5.0 5.0 - 8.0   Glucose, UA NEGATIVE NEGATIVE mg/dL   Hgb urine dipstick SMALL (A) NEGATIVE   Bilirubin Urine NEGATIVE NEGATIVE    Ketones, ur 20 (A) NEGATIVE mg/dL   Protein, ur 30 (A) NEGATIVE mg/dL   Nitrite NEGATIVE NEGATIVE   Leukocytes,Ua MODERATE (A) NEGATIVE   RBC / HPF 0-5 0 - 5 RBC/hpf   WBC, UA 0-5 0 - 5 WBC/hpf   Bacteria, UA RARE (A) NONE SEEN   Squamous Epithelial / LPF 11-20 0 - 5   Mucus PRESENT     Comment: Performed at [facility], [address] [place], [place] [address]  Resp Panel by RT-PCR (Flu A&B, Covid) Nasopharyngeal Swab     Status: None   Collection Time: 01/19/21  2:06 PM   Specimen: Nasopharyngeal Swab; Nasopharyngeal(NP) swabs in vial transport medium  Result Value Ref Range   SARS Coronavirus 2 by RT PCR NEGATIVE NEGATIVE    Comment: (NOTE) SARS-CoV-2 target nucleic acids are NOT DETECTED.  The SARS-CoV-2 RNA is generally detectable in upper respiratory specimens during the acute phase of infection. The lowest concentration of SARS-CoV-2 viral copies this assay can detect is 138 copies/mL. A negative result does not preclude SARS-Cov-2 infection and should not be used as the sole basis for treatment or other patient management decisions. A negative result may occur with  improper specimen collection/handling, submission of specimen other than nasopharyngeal swab, presence of viral mutation(s) within the areas targeted by this assay, and inadequate number of viral copies(<138 copies/mL). A negative result  must be combined with clinical observations, patient history, and epidemiological information. The expected result is Negative.  Fact Sheet for Patients:  EntrepreneurPulse.com.au  Fact Sheet for Healthcare Providers:  IncredibleEmployment.be  This test is no t yet approved or cleared by the [place] FDA and  has been authorized for detection and/or diagnosis of SARS-CoV-2 by FDA under an Emergency Use Authorization (EUA). This EUA will remain  in effect (meaning this test can be used) for the duration of the COVID-19  declaration under Section 564(b)(1) of the Act, 21 U.S.C.section 360bbb-3(b)(1), unless the authorization is terminated  or revoked sooner.       Influenza A by PCR NEGATIVE NEGATIVE   Influenza B by PCR NEGATIVE NEGATIVE    Comment: (NOTE) The Xpert Xpress SARS-CoV-2/FLU/RSV plus assay is intended as an aid in the diagnosis of influenza from Nasopharyngeal swab specimens and should not be used as a sole basis for treatment. Nasal washings and aspirates are unacceptable for Xpert Xpress SARS-CoV-2/FLU/RSV testing.  Fact Sheet for Patients: EntrepreneurPulse.com.au  Fact Sheet for Healthcare Providers: IncredibleEmployment.be  This test is not yet approved or cleared by the [place] FDA and has been authorized for detection and/or diagnosis of SARS-CoV-2 by FDA under an Emergency Use Authorization (EUA). This EUA will remain in effect (meaning this test can be used) for the duration of the COVID-19 declaration under Section 564(b)(1) of the Act, 21 U.S.C. section 360bbb-3(b)(1), unless the authorization is terminated or revoked.  Performed at [facility], [address], [place], [place] [address]   Surgical PCR screen     Status: None   Collection Time: 01/19/21  5:50 PM   Specimen: Nasal Mucosa; Nasal Swab  Result Value Ref Range   MRSA, PCR NEGATIVE NEGATIVE   Staphylococcus aureus NEGATIVE NEGATIVE    Comment: (NOTE) The Xpert SA Assay (FDA approved for NASAL specimens in patients 32 years of age and older), is one component of a comprehensive surveillance program. It is not intended to diagnose infection nor to guide or monitor treatment. Performed at [facility], [address], [place], [place] [address]   Basic metabolic panel     Status: Abnormal   Collection Time: 01/20/21  5:07 AM  Result Value Ref Range   Sodium 142 135 - 145 mmol/L   Potassium 4.4 3.5 - 5.1 mmol/L   Chloride 110 98 - 111 mmol/L   CO2 29 22 - 32  mmol/L   Glucose, Bld 93 70 - 99 mg/dL    Comment: Glucose reference range applies only to samples taken after fasting for at least 8 hours.   BUN 13 8 - 23 mg/dL   Creatinine, Ser 0.71 0.44 - 1.00 mg/dL   Calcium 8.9 8.9 - 10.3 mg/dL   GFR, Estimated >60 >60 mL/min    Comment: (NOTE) Calculated using the CKD-EPI Creatinine Equation (2021)    Anion gap 3 (L) 5 - 15    Comment: Performed at [facility], [address], [place], [place] [address]  CBC     Status: None   Collection Time: 01/20/21  5:07 AM  Result Value Ref Range   WBC 4.6 4.0 - 10.5 K/uL   RBC 4.37 3.87 - 5.11 MIL/uL   Hemoglobin 12.9 12.0 - 15.0 g/dL   HCT 39.9 36.0 - 46.0 %   MCV 91.3 80.0 - 100.0 fL   MCH 29.5 26.0 - 34.0 pg   MCHC 32.3 30.0 - 36.0 g/dL   RDW 13.3 11.5 - 15.5 %  Platelets 209 150 - 400 K/uL   nRBC 0.0 0.0 - 0.2 %    Comment: Performed at [facility], [address] [place], [place] [address]   Personally reviewed- gallstones and polyp  US Abdomen Limited  Result Date: 01/19/2021 CLINICAL DATA:  Abdominal pain.  Gallstone on recent CT. EXAM: ULTRASOUND ABDOMEN LIMITED RIGHT UPPER QUADRANT COMPARISON:  CT of 1 day prior from [facility] rocking [facility], report only FINDINGS: Gallbladder: Mobile gallstones at up to 9 mm. No wall thickening or pericholecystic fluid. Sonographic Murphy's sign was not elicited. Probable gallbladder polyp at 8 mm. Common bile duct: Diameter: Normal, 4 mm. Liver: No focal lesion identified. Within normal limits in parenchymal echogenicity. Portal vein is patent on color Doppler imaging with normal direction of blood flow towards the liver. Other: None. IMPRESSION: Cholelithiasis without acute cholecystitis. 8 mm gallbladder polyp. Per consensus criteria, follow-up ultrasound at 1 year is recommended. This recommendation follows ACR consensus guidelines: White Paper of the ACR Incidental Findings Committee II on Gallbladder and Biliary Findings. J Am Coll Radiol 2013:;10:953-956.  Electronically Signed   By: Abigail Miyamoto M.D.   On: 01/19/2021 11:14     Assessment & Plan:  EMMER LILLIBRIDGE is a 76 y.o. female with symptomatic gallstones and worsening pain. Had been on antibiotics so could have some cholecystitis brewing that is masked by the antibiotics.   PLAN: I counseled the patient about the indication, risks and benefits of laparoscopic cholecystectomy.  She understands there is a very small chance for bleeding, infection, injury to normal structures (including common bile duct), conversion to open surgery, persistent symptoms, evolution of postcholecystectomy diarrhea, need for secondary interventions, anesthesia reaction, cardiopulmonary issues and other risks not specifically detailed here. I described the expected recovery, the plan for follow-up and the restrictions during the recovery phase.  All questions were answered.   All questions were answered to the satisfaction of the patient.     Virl Cagey 01/20/2021, 9:13 AM

## 2021-01-20 NOTE — Progress Notes (Signed)
Ekg done and given to nurse

## 2021-01-20 NOTE — Op Note (Signed)
Operative Note   Preoperative Diagnosis: Symptomatic cholelithiasis   Postoperative Diagnosis: Same   Procedure(s) Performed: Laparoscopic cholecystectomy   Surgeon: Ria Comment C. Constance Haw, MD   Assistants: Graciella Freer, DO  Anesthesia: General endotracheal   Anesthesiologist: Louann Sjogren, MD    Specimens: Gallbladder    Estimated Blood Loss: Minimal    Blood Replacement: None    Complications: None    Operative Findings: Distended gallbladder with stones    Procedure: The patient was taken to the operating room and placed supine. General endotracheal anesthesia was induced. Intravenous antibiotics were  administered per protocol. An orogastric tube positioned to decompress the stomach. The abdomen was prepared and draped in the usual sterile fashion.    A supraumbilical  incision was made and a Veress technique was utilized to achieve pneumoperitoneum to 15 mmHg with carbon dioxide. A 11 mm optiview port was placed through the supraumbilical region, and a 10 mm 0-degree operative laparoscope was introduced. The area underlying the trocar and Veress needle were inspected and without evidence of injury.  Remaining trocars were placed under direct vision. Two 5 mm ports were placed in the right abdomen, between the anterior axillary and midclavicular line.  A final 11 mm port was placed through the mid-epigastrium, near the falciform ligament.    The gallbladder fundus was elevated cephalad and the infundibulum was retracted to the patient's right. The gallbladder/cystic duct junction was skeletonized. The cystic artery noted in the triangle of Calot and was also skeletonized.  We then continued liberal medial and lateral dissection until the critical view of safety was achieved.    The cystic duct and cystic artery were doubly clipped and divided. The gallbladder was then dissected from the liver bed with electrocautery. The specimen was placed in an Endopouch and was retrieved  through the epigastric site.   Final inspection revealed acceptable hemostasis. Surgical SNOW was placed in the gallbladder bed.  Trocars were removed and pneumoperitoneum was released.  0 Vicryl fascial sutures was used to close the umbilical port site and the epigastric site was over the rib.  Skin incisions were closed with 4-0 Monocryl subcuticular sutures and Dermabond. The patient was awakened from anesthesia and extubated without complication.    Curlene Labrum, MD [facility] [address] [place], [place] [address] [phone] (office)

## 2021-01-20 NOTE — Progress Notes (Signed)
[facility]  Tried to call husband, call was lost. Tried to leave VM but was full. Will try back later.  Can go home later today if feeling ok. RN can let me know.   Curlene Labrum, MD [facility] [address] [place], [place] [address] [phone] (office)

## 2021-01-20 NOTE — Anesthesia Procedure Notes (Signed)
Procedure Name: Intubation Date/Time: 01/20/2021 11:37 AM Performed by: Karna Dupes, CRNA Pre-anesthesia Checklist: Patient identified, Emergency Drugs available, Suction available and Patient being monitored Patient Re-evaluated:Patient Re-evaluated prior to induction Oxygen Delivery Method: Circle system utilized Preoxygenation: Pre-oxygenation with 100% oxygen Induction Type: IV induction Ventilation: Mask ventilation without difficulty Grade View: Grade III Tube type: Oral Tube size: 7.0 mm Number of attempts: 1 Airway Equipment and Method: Stylet Placement Confirmation: ETT inserted through vocal cords under direct vision, positive ETCO2 and breath sounds checked- equal and bilateral Secured at: 21 cm Tube secured with: Tape Dental Injury: Teeth and Oropharynx as per pre-operative assessment

## 2021-01-20 NOTE — Transfer of Care (Signed)
Immediate Anesthesia Transfer of Care Note  Patient: [name]  Procedure(s) Performed: LAPAROSCOPIC CHOLECYSTECTOMY (Abdomen)  Patient Location: PACU  Anesthesia Type:General  Level of Consciousness: awake and alert   Airway & Oxygen Therapy: Patient Spontanous Breathing and Patient connected to nasal cannula oxygen  Post-op Assessment: Report given to RN and Post -op Vital signs reviewed and stable  Post vital signs: Reviewed and stable  Last Vitals:  Vitals Value Taken Time  BP 127/63 01/20/21 1217  Temp    Pulse 83 01/20/21 1219  Resp 13 01/20/21 1219  SpO2 97 % 01/20/21 1219  Vitals shown include unvalidated device data.  Last Pain:  Vitals:   01/20/21 1000  TempSrc: Oral  PainSc: 0-No pain      Patients Stated Pain Goal: 8 (82/86/75 1982)  Complications: No notable events documented.

## 2021-01-20 NOTE — Progress Notes (Signed)
Patient has rested well on night shift . No c/o N/V nor pain. Patient has been NPO since midnight for procedure 01/20/21. No orders at this time to obtain consent for cholecystectomy.

## 2021-01-20 NOTE — Progress Notes (Signed)
PROGRESS NOTE  Alyssa MCCREADY ZOX:[id number] DOB: January 25, 1945 DOA: 01/19/2021 PCP: [name], MD  Brief History:  76 year old female with a history of nephrolithiasis and no other documented chronic medical problems presenting with right flank pain that intermittently radiates to the right upper quadrant for the past 2 months.  She states that she has had intermittent nausea and vomiting.  She has not had any hematemesis, diarrhea, hematochezia, melena.  she states that the pain is worsened after eating meals.  She describes " acid reflux" sensation after she eats that radiates to her throat.  She takes some Tums which gives her relief.  Occasionally she belches which also provides relief.  She states that her PCP has recently prescribed her to courses of antibiotics for presumptive UTI.  She remembers that one of the courses with cephalexin.  In the past 2 days, her right flank pain and right abdominal pain had worsened with worsening nausea and vomiting.  She had a CT of the abdomen and pelvis performed on 01/18/2021 at [facility] which showed cholelithiasis, sigmoid diverticulosis, but no other acute abnormality.  There was no biliary ductal dilatation. In the ED, right upper quadrant ultrasound showed cholelithiasis without cholecystitis.  There was a millimeter gallbladder polyp.  LFTs were unremarkable.  WBC 8.1, hemoglobin 14.6, platelets 243,000.  Sodium 142, potassium 4.4, CO2 29, serum creatinine 0.71  Assessment/Plan: Symptomatic cholelithiasis -Appreciate surgery evaluation -Plans noted for possible laparoscopic cholecystectomy -Judicious opioids -Continue IV fluids -Personally reviewed EKG--sinus rhythm, nonspecific T wave change  Bacteriuria -No pyuria noted on UA             Family Communication:   no Family at bedside  Consultants:  general surgery  Code Status:  FULL   DVT Prophylaxis:  SCDs   Procedures: As Listed in Progress Note  Above  Antibiotics: Ceftriaxone 1/11>>        Subjective: Patient states that she has not had any emesis since arrival to the hospital.  She states her abdominal pain is controlled presently.  She denies any fevers, chills, chest pain, shortness of breath, diarrhea, hematochezia, melena.  There is no dysuria present  Objective: Vitals:   01/19/21 1723 01/19/21 2151 01/20/21 0206 01/20/21 0608  BP: (!) 151/82 120/71 130/85 125/67  Pulse: 84 62 67 62  Resp: 20 17 17 18   Temp: 97.6 F (36.4 C) 98.1 F (36.7 C) 97.8 F (36.6 C) 97.8 F (36.6 C)  TempSrc: Oral Oral Oral Oral  SpO2: 100% 97% 99% 95%  Weight:      Height:        Intake/Output Summary (Last 24 hours) at 01/20/2021 0723 Last data filed at 01/20/2021 0300 Gross per 24 hour  Intake 729.15 ml  Output --  Net 729.15 ml   Weight change:  Exam:  General:  Pt is alert, follows commands appropriately, not in acute distress HEENT: No icterus, No thrush, No neck mass, [place]/AT Cardiovascular: RRR, S1/S2, no rubs, no gallops Respiratory: CTA bilaterally, no wheezing, no crackles, no rhonchi Abdomen: Soft/+BS, mild RUQ tender, non distended, no guarding Extremities: No edema, No lymphangitis, No petechiae, No rashes, no synovitis   Data Reviewed: I have personally reviewed following labs and imaging studies Basic Metabolic Panel: Recent Labs  Lab 01/19/21 0945 01/20/21 0507  NA 139 142  K 3.9 4.4  CL 107 110  CO2 24 29  GLUCOSE 120* 93  BUN 18 13  CREATININE 0.73 0.71  CALCIUM 9.2 8.9   Liver Function Tests: Recent Labs  Lab 01/19/21 0945  AST 21  ALT 11  ALKPHOS 77  BILITOT 0.9  PROT 6.7  ALBUMIN 4.2   Recent Labs  Lab 01/19/21 0945  LIPASE 31   No results for input(s): AMMONIA in the last 168 hours. Coagulation Profile: No results for input(s): INR, PROTIME in the last 168 hours. CBC: Recent Labs  Lab 01/19/21 0945 01/20/21 0507  WBC 8.1 4.6  NEUTROABS 6.6  --   HGB 14.6 12.9  HCT  43.6 39.9  MCV 91.4 91.3  PLT 243 209   Cardiac Enzymes: No results for input(s): CKTOTAL, CKMB, CKMBINDEX, TROPONINI in the last 168 hours. BNP: Invalid input(s): POCBNP CBG: No results for input(s): GLUCAP in the last 168 hours. HbA1C: No results for input(s): HGBA1C in the last 72 hours. Urine analysis:    Component Value Date/Time   COLORURINE YELLOW 01/19/2021 1222   APPEARANCEUR HAZY (A) 01/19/2021 1222   LABSPEC 1.028 01/19/2021 1222   PHURINE 5.0 01/19/2021 1222   GLUCOSEU NEGATIVE 01/19/2021 1222   HGBUR SMALL (A) 01/19/2021 1222   BILIRUBINUR NEGATIVE 01/19/2021 1222   KETONESUR 20 (A) 01/19/2021 1222   PROTEINUR 30 (A) 01/19/2021 1222   NITRITE NEGATIVE 01/19/2021 1222   LEUKOCYTESUR MODERATE (A) 01/19/2021 1222   Sepsis Labs: @LABRCNTIP (procalcitonin:4,lacticidven:4) ) Recent Results (from the past 240 hour(s))  Resp Panel by RT-PCR (Flu A&B, Covid) Nasopharyngeal Swab     Status: None   Collection Time: 01/19/21  2:06 PM   Specimen: Nasopharyngeal Swab; Nasopharyngeal(NP) swabs in vial transport medium  Result Value Ref Range Status   SARS Coronavirus 2 by RT PCR NEGATIVE NEGATIVE Final    Comment: (NOTE) SARS-CoV-2 target nucleic acids are NOT DETECTED.  The SARS-CoV-2 RNA is generally detectable in upper respiratory specimens during the acute phase of infection. The lowest concentration of SARS-CoV-2 viral copies this assay can detect is 138 copies/mL. A negative result does not preclude SARS-Cov-2 infection and should not be used as the sole basis for treatment or other patient management decisions. A negative result may occur with  improper specimen collection/handling, submission of specimen other than nasopharyngeal swab, presence of viral mutation(s) within the areas targeted by this assay, and inadequate number of viral copies(<138 copies/mL). A negative result must be combined with clinical observations, patient history, and  epidemiological information. The expected result is Negative.  Fact Sheet for Patients:  EntrepreneurPulse.com.au  Fact Sheet for Healthcare Providers:  IncredibleEmployment.be  This test is no t yet approved or cleared by the [place] FDA and  has been authorized for detection and/or diagnosis of SARS-CoV-2 by FDA under an Emergency Use Authorization (EUA). This EUA will remain  in effect (meaning this test can be used) for the duration of the COVID-19 declaration under Section 564(b)(1) of the Act, 21 U.S.C.section 360bbb-3(b)(1), unless the authorization is terminated  or revoked sooner.       Influenza A by PCR NEGATIVE NEGATIVE Final   Influenza B by PCR NEGATIVE NEGATIVE Final    Comment: (NOTE) The Xpert Xpress SARS-CoV-2/FLU/RSV plus assay is intended as an aid in the diagnosis of influenza from Nasopharyngeal swab specimens and should not be used as a sole basis for treatment. Nasal washings and aspirates are unacceptable for Xpert Xpress SARS-CoV-2/FLU/RSV testing.  Fact Sheet for Patients: EntrepreneurPulse.com.au  Fact Sheet for Healthcare Providers: IncredibleEmployment.be  This test is not yet approved or cleared by the [place] and has been authorized  for detection and/or diagnosis of SARS-CoV-2 by FDA under an Emergency Use Authorization (EUA). This EUA will remain in effect (meaning this test can be used) for the duration of the COVID-19 declaration under Section 564(b)(1) of the Act, 21 U.S.C. section 360bbb-3(b)(1), unless the authorization is terminated or revoked.  Performed at [facility], [address], [place], [place] [address]   Surgical PCR screen     Status: None   Collection Time: 01/19/21  5:50 PM   Specimen: Nasal Mucosa; Nasal Swab  Result Value Ref Range Status   MRSA, PCR NEGATIVE NEGATIVE Final   Staphylococcus aureus NEGATIVE NEGATIVE Final     Comment: (NOTE) The Xpert SA Assay (FDA approved for NASAL specimens in patients 4 years of age and older), is one component of a comprehensive surveillance program. It is not intended to diagnose infection nor to guide or monitor treatment. Performed at [facility], [address] [place],  [address]      Scheduled Meds:  mupirocin ointment  1 application Nasal BID   pneumococcal 23 valent vaccine  0.5 mL Intramuscular Tomorrow-1000   Continuous Infusions:  cefTRIAXone (ROCEPHIN)  IV Stopped (01/19/21 1832)   lactated ringers 75 mL/hr at 01/19/21 1840    Procedures/Studies: US Abdomen Limited  Result Date: 01/19/2021 CLINICAL DATA:  Abdominal pain.  Gallstone on recent CT. EXAM: ULTRASOUND ABDOMEN LIMITED RIGHT UPPER QUADRANT COMPARISON:  CT of 1 day prior from [facility] rocking ham, report only FINDINGS: Gallbladder: Mobile gallstones at up to 9 mm. No wall thickening or pericholecystic fluid. Sonographic Murphy's sign was not elicited. Probable gallbladder polyp at 8 mm. Common bile duct: Diameter: Normal, 4 mm. Liver: No focal lesion identified. Within normal limits in parenchymal echogenicity. Portal vein is patent on color Doppler imaging with normal direction of blood flow towards the liver. Other: None. IMPRESSION: Cholelithiasis without acute cholecystitis. 8 mm gallbladder polyp. Per consensus criteria, follow-up ultrasound at 1 year is recommended. This recommendation follows ACR consensus guidelines: White Paper of the ACR Incidental Findings Committee II on Gallbladder and Biliary Findings. J Am Coll Radiol 2013:;10:953-956. Electronically Signed   By: Abigail Miyamoto M.D.   On: 01/19/2021 11:14    Orson Eva, DO  [facility]  If 7PM-7AM, please contact night-coverage [email or web address] Password [email or web address] 01/20/2021, 7:23 AM   LOS: 1 day

## 2021-01-20 NOTE — Progress Notes (Signed)
Patient complaining of chest pain and diaphoretic around 1830. Vitals stable, Dr.Bridges messaged. EKG done and put in chart. Patient noted that pain felt like a bubble. Pain the moved around to patient's back. Oncoming nurse made aware of situation. No new orders at this time.

## 2021-01-20 NOTE — Discharge Instructions (Signed)
Discharge Laparoscopic Surgery Instructions:  Common Complaints: Right shoulder pain is common after laparoscopic surgery. This is secondary to the gas used in the surgery being trapped under the diaphragm.  Walk to help your body absorb the gas. This will improve in a few days. Pain at the port sites are common, especially the larger port sites. This will improve with time.  Some nausea is common and poor appetite. The main goal is to stay hydrated the first few days after surgery.   Diet/ Activity: Diet as tolerated. You may not have an appetite, but it is important to stay hydrated. Drink 64 ounces of water a day. Your appetite will return with time.  Shower per your regular routine daily.  Do not take hot showers. Take warm showers that are less than 10 minutes. Rest and listen to your body, but do not remain in bed all day.  Walk everyday for at least 15-20 minutes. Deep cough and move around every 1-2 hours in the first few days after surgery.  Do not lift > 10 lbs, perform excessive bending, pushing, pulling, squatting for 1-2 weeks after surgery.  Do not pick at the dermabond glue on your incision sites.  This glue film will remain in place for 1-2 weeks and will start to peel off.  Do not place lotions or balms on your incision unless instructed to specifically by Dr. Parys Elenbaas.   Pain Expectations and Narcotics: -After surgery you will have pain associated with your incisions and this is normal. The pain is muscular and nerve pain, and will get better with time. -You are encouraged and expected to take non narcotic medications like tylenol and ibuprofen (when able) to treat pain as multiple modalities can aid with pain treatment. -Narcotics are only used when pain is severe or there is breakthrough pain. -You are not expected to have a pain score of 0 after surgery, as we cannot prevent pain. A pain score of 3-4 that allows you to be functional, move, walk, and tolerate some activity is  the goal. The pain will continue to improve over the days after surgery and is dependent on your surgery. -Due to [place] law, we are only able to give a certain amount of pain medication to treat post operative pain, and we only give additional narcotics on a patient by patient basis.  -For most laparoscopic surgery, studies have shown that the majority of patients only need 10-15 narcotic pills, and for open surgeries most patients only need 15-20.   -Having appropriate expectations of pain and knowledge of pain management with non narcotics is important as we do not want anyone to become addicted to narcotic pain medication.  -Using ice packs in the first 48 hours and heating pads after 48 hours, wearing an abdominal binder (when recommended), and using over the counter medications are all ways to help with pain management.   -Simple acts like meditation and mindfulness practices after surgery can also help with pain control and research has proven the benefit of these practices.  Medication: Take tylenol and ibuprofen as needed for pain control, alternating every 4-6 hours.  Example:  Tylenol 1000mg @ 6am, 12noon, 6pm, 12midnight (Do not exceed 4000mg of tylenol a day). Ibuprofen 800mg @ 9am, 3pm, 9pm, 3am (Do not exceed 3600mg of ibuprofen a day).  Take Roxicodone for breakthrough pain every 4 hours.  Take Colace for constipation related to narcotic pain medication. If you do not have a bowel movement in 2 days, take Miralax   over the counter.  Drink plenty of water to also prevent constipation.   Contact Information: If you have questions or concerns, please call our office, [phone], Monday- Thursday 8AM-5PM and Friday 8AM-12Noon.  If it is after hours or on the weekend, please call [facility], [phone], [phone], and ask to speak to the surgeon on call for Dr. Omid Deardorff at [facility].   

## 2021-01-21 ENCOUNTER — Encounter (HOSPITAL_COMMUNITY): Payer: Self-pay | Admitting: General Surgery

## 2021-01-21 LAB — SURGICAL PATHOLOGY

## 2021-01-21 MED ORDER — ONDANSETRON HCL 4 MG PO TABS: 4.0000 mg | ORAL_TABLET | Freq: Four times a day (QID) | ORAL | 0 refills | Status: AC | PRN

## 2021-01-21 MED ORDER — SIMETHICONE 80 MG PO CHEW
80.0000 mg | CHEWABLE_TABLET | Freq: Four times a day (QID) | ORAL | 0 refills | Status: AC | PRN
Start: 2021-01-21 — End: ?

## 2021-01-21 MED ORDER — CYCLOBENZAPRINE HCL 5 MG PO TABS: 5.0000 mg | ORAL_TABLET | Freq: Three times a day (TID) | ORAL | 0 refills | Status: AC | PRN

## 2021-01-21 MED ORDER — OXYCODONE HCL 5 MG PO TABS: 5.0000 mg | ORAL_TABLET | ORAL | 0 refills | Status: AC | PRN

## 2021-01-21 NOTE — Progress Notes (Signed)
PROGRESS NOTE  [name] IRW:[id number] DOB: 1945/05/26 DOA: 01/19/2021 PCP: [name], MD    Brief History:  76 year old female with a history of nephrolithiasis and no other documented chronic medical problems presenting with right flank pain that intermittently radiates to the right upper quadrant for the past 2 months.  She states that she has had intermittent nausea and vomiting.  She has not had any hematemesis, diarrhea, hematochezia, melena.  she states that the pain is worsened after eating meals.  She describes " acid reflux" sensation after she eats that radiates to her throat.  She takes some Tums which gives her relief.  Occasionally she belches which also provides relief.  She states that her PCP has recently prescribed her to courses of antibiotics for presumptive UTI.  She remembers that one of the courses with cephalexin.  In the past 2 days, her right flank pain and right abdominal pain had worsened with worsening nausea and vomiting.  She had a CT of the abdomen and pelvis performed on 01/18/2021 at [facility] which showed cholelithiasis, sigmoid diverticulosis, but no other acute abnormality.  There was no biliary ductal dilatation. In the ED, right upper quadrant ultrasound showed cholelithiasis without cholecystitis.  There was a millimeter gallbladder polyp.  LFTs were unremarkable.  WBC 8.1, hemoglobin 14.6, platelets 243,000.  Sodium 142, potassium 4.4, CO2 29, serum creatinine 0.71   Assessment/Plan: Symptomatic cholelithiasis -Appreciate surgery evaluation -01/20/21 laparoscopic cholecystectomy -Judicious opioids -Continue IV fluids -Personally reviewed EKG--sinus rhythm, nonspecific T wave change -remained stable post op>>diet advanced which patient tolerated -outpt follow with Dr. Constance Haw   Bacteriuria -No pyuria noted on UA                         Family Communication:   no Family at bedside   Consultants:  general surgery   Code Status:   FULL    DVT Prophylaxis:  SCDs     Procedures: As Listed in Progress Note Above   Antibiotics: Ceftriaxone 1/11>>1/12          Subjective: Patient has surgical site pain.  Denies n/v/d, cp, sob  Objective: Vitals:   01/20/21 1652 01/20/21 2020 01/21/21 0415 01/21/21 1249  BP: 103/60 123/67 130/70 121/63  Pulse: 72 80 73 88  Resp: 18 18 16 18   Temp: 98.2 F (36.8 C) 98.3 F (36.8 C) 98.1 F (36.7 C) 98.2 F (36.8 C)  TempSrc: Oral Oral Oral Oral  SpO2: 97% 99% 97% 96%  Weight:      Height:        Intake/Output Summary (Last 24 hours) at 01/21/2021 1828 Last data filed at 01/21/2021 1300 Gross per 24 hour  Intake 480 ml  Output --  Net 480 ml   Weight change:  Exam:  General:  Pt is alert, follows commands appropriately, not in acute distress HEENT: No icterus, No thrush, No neck mass, [place]/AT Cardiovascular: RRR, S1/S2, no rubs, no gallops Respiratory: CTA bilaterally, no wheezing, no crackles, no rhonchi Abdomen: Soft/+BS, mild tender, non distended, no guarding Extremities: No edema, No lymphangitis, No petechiae, No rashes, no synovitis   Data Reviewed: I have personally reviewed following labs and imaging studies Basic Metabolic Panel: Recent Labs  Lab 01/19/21 0945 01/20/21 0507  NA 139 142  K 3.9 4.4  CL 107 110  CO2 24 29  GLUCOSE 120* 93  BUN 18 13  CREATININE 0.73 0.71  CALCIUM 9.2 8.9   Liver Function Tests: Recent Labs  Lab 01/19/21 0945  AST 21  ALT 11  ALKPHOS 77  BILITOT 0.9  PROT 6.7  ALBUMIN 4.2   Recent Labs  Lab 01/19/21 0945  LIPASE 31   No results for input(s): AMMONIA in the last 168 hours. Coagulation Profile: No results for input(s): INR, PROTIME in the last 168 hours. CBC: Recent Labs  Lab 01/19/21 0945 01/20/21 0507  WBC 8.1 4.6  NEUTROABS 6.6  --   HGB 14.6 12.9  HCT 43.6 39.9  MCV 91.4 91.3  PLT 243 209   Cardiac Enzymes: No results for input(s): CKTOTAL, CKMB, CKMBINDEX, TROPONINI in the last  168 hours. BNP: Invalid input(s): POCBNP CBG: Recent Labs  Lab 01/20/21 1836  GLUCAP 116*   HbA1C: No results for input(s): HGBA1C in the last 72 hours. Urine analysis:    Component Value Date/Time   COLORURINE YELLOW 01/19/2021 1222   APPEARANCEUR HAZY (A) 01/19/2021 1222   LABSPEC 1.028 01/19/2021 1222   PHURINE 5.0 01/19/2021 1222   GLUCOSEU NEGATIVE 01/19/2021 1222   HGBUR SMALL (A) 01/19/2021 1222   BILIRUBINUR NEGATIVE 01/19/2021 1222   KETONESUR 20 (A) 01/19/2021 1222   PROTEINUR 30 (A) 01/19/2021 1222   NITRITE NEGATIVE 01/19/2021 1222   LEUKOCYTESUR MODERATE (A) 01/19/2021 1222   Sepsis Labs: @LABRCNTIP (procalcitonin:4,lacticidven:4) ) Recent Results (from the past 240 hour(s))  Urine Culture     Status: None   Collection Time: 01/19/21 12:22 PM   Specimen: Urine, Clean Catch  Result Value Ref Range Status   Specimen Description   Final    URINE, CLEAN CATCH Performed at [facility], [address], [place], [place] [address]    Special Requests   Final    NONE Performed at [facility], [address] [place], [place] [address]    Culture   Final    NO GROWTH Performed at [facility], [place] [address] [place],  [address]    Report Status 01/20/2021 FINAL  Final  Resp Panel by RT-PCR (Flu A&B, Covid) Nasopharyngeal Swab     Status: None   Collection Time: 01/19/21  2:06 PM   Specimen: Nasopharyngeal Swab; Nasopharyngeal(NP) swabs in vial transport medium  Result Value Ref Range Status   SARS Coronavirus 2 by RT PCR NEGATIVE NEGATIVE Final    Comment: (NOTE) SARS-CoV-2 target nucleic acids are NOT DETECTED.  The SARS-CoV-2 RNA is generally detectable in upper respiratory specimens during the acute phase of infection. The lowest concentration of SARS-CoV-2 viral copies this assay can detect is 138 copies/mL. A negative result does not preclude SARS-Cov-2 infection and should not be used as the sole basis for treatment or other patient  management decisions. A negative result may occur with  improper specimen collection/handling, submission of specimen other than nasopharyngeal swab, presence of viral mutation(s) within the areas targeted by this assay, and inadequate number of viral copies(<138 copies/mL). A negative result must be combined with clinical observations, patient history, and epidemiological information. The expected result is Negative.  Fact Sheet for Patients:  EntrepreneurPulse.com.au  Fact Sheet for Healthcare Providers:  IncredibleEmployment.be  This test is no t yet approved or cleared by the [place] FDA and  has been authorized for detection and/or diagnosis of SARS-CoV-2 by FDA under an Emergency Use Authorization (EUA). This EUA will remain  in effect (meaning this test can be used) for the duration of the COVID-19 declaration under Section 564(b)(1) of the Act, 21 U.S.C.section  360bbb-3(b)(1), unless the authorization is terminated  or revoked sooner.       Influenza A by PCR NEGATIVE NEGATIVE Final   Influenza B by PCR NEGATIVE NEGATIVE Final    Comment: (NOTE) The Xpert Xpress SARS-CoV-2/FLU/RSV plus assay is intended as an aid in the diagnosis of influenza from Nasopharyngeal swab specimens and should not be used as a sole basis for treatment. Nasal washings and aspirates are unacceptable for Xpert Xpress SARS-CoV-2/FLU/RSV testing.  Fact Sheet for Patients: EntrepreneurPulse.com.au  Fact Sheet for Healthcare Providers: IncredibleEmployment.be  This test is not yet approved or cleared by the [place] FDA and has been authorized for detection and/or diagnosis of SARS-CoV-2 by FDA under an Emergency Use Authorization (EUA). This EUA will remain in effect (meaning this test can be used) for the duration of the COVID-19 declaration under Section 564(b)(1) of the Act, 21 U.S.C. section 360bbb-3(b)(1),  unless the authorization is terminated or revoked.  Performed at [facility], [address], [place], [place] [address]   Surgical PCR screen     Status: None   Collection Time: 01/19/21  5:50 PM   Specimen: Nasal Mucosa; Nasal Swab  Result Value Ref Range Status   MRSA, PCR NEGATIVE NEGATIVE Final   Staphylococcus aureus NEGATIVE NEGATIVE Final    Comment: (NOTE) The Xpert SA Assay (FDA approved for NASAL specimens in patients 32 years of age and older), is one component of a comprehensive surveillance program. It is not intended to diagnose infection nor to guide or monitor treatment. Performed at [facility], [address] [place], [place] [address]      Scheduled Meds:  pneumococcal 23 valent vaccine  0.5 mL Intramuscular Tomorrow-1000   Continuous Infusions:  cefTRIAXone (ROCEPHIN)  IV 1 g (01/20/21 1952)    Procedures/Studies: US Abdomen Limited  Result Date: 01/19/2021 CLINICAL DATA:  Abdominal pain.  Gallstone on recent CT. EXAM: ULTRASOUND ABDOMEN LIMITED RIGHT UPPER QUADRANT COMPARISON:  CT of 1 day prior from [facility] rocking ham, report only FINDINGS: Gallbladder: Mobile gallstones at up to 9 mm. No wall thickening or pericholecystic fluid. Sonographic Murphy's sign was not elicited. Probable gallbladder polyp at 8 mm. Common bile duct: Diameter: Normal, 4 mm. Liver: No focal lesion identified. Within normal limits in parenchymal echogenicity. Portal vein is patent on color Doppler imaging with normal direction of blood flow towards the liver. Other: None. IMPRESSION: Cholelithiasis without acute cholecystitis. 8 mm gallbladder polyp. Per consensus criteria, follow-up ultrasound at 1 year is recommended. This recommendation follows ACR consensus guidelines: White Paper of the ACR Incidental Findings Committee II on Gallbladder and Biliary Findings. J Am Coll Radiol 2013:;10:953-956. Electronically Signed   By: Abigail Miyamoto M.D.   On: 01/19/2021 11:14    Orson Eva, DO  [facility]  If 7PM-7AM, please contact night-coverage [email or web address] Password [facility] 01/21/2021, 6:28 PM   LOS: 2 days

## 2021-01-21 NOTE — Progress Notes (Signed)
Nsg Discharge Note  Admit Date:  01/19/2021 Discharge date: 01/21/2021   [name] to be D/C'd Home per MD order.  AVS completed.  Copy for chart, and copy for patient signed, and dated. Patient/caregiver able to verbalize understanding.  Discharge Medication: Allergies as of 01/21/2021       Reactions   Sulfa Antibiotics Rash   Penicillins Hives   Tolerates Cephalosporin 01/2021    Other Rash   walnut        Medication List     STOP taking these medications    cephALEXin 500 MG capsule Commonly known as: Keflex   predniSONE 10 MG tablet Commonly known as: DELTASONE       TAKE these medications    aspirin EC 81 MG tablet Take 1 tablet (81 mg total) by mouth daily.   cyclobenzaprine 5 MG tablet Commonly known as: FLEXERIL Take 1 tablet (5 mg total) by mouth 3 (three) times daily as needed for muscle spasms.   meloxicam 7.5 MG tablet Commonly known as: MOBIC TAKE 1 TABLET BY MOUTH EVERY DAY   ondansetron 4 MG tablet Commonly known as: ZOFRAN Take 1 tablet (4 mg total) by mouth every 6 (six) hours as needed for nausea.   oxyCODONE 5 MG immediate release tablet Commonly known as: Oxy IR/ROXICODONE Take 1 tablet (5 mg total) by mouth every 4 (four) hours as needed for breakthrough pain or severe pain.   POTASSIUM PO Take 1 tablet by mouth daily.   sennosides-docusate sodium 8.6-50 MG tablet Commonly known as: SENOKOT-S Take 1 tablet by mouth as needed for constipation.   simethicone 80 MG chewable tablet Commonly known as: MYLICON Chew 1 tablet (80 mg total) by mouth 4 (four) times daily as needed for flatulence.   vitamin C 500 MG tablet Commonly known as: ASCORBIC ACID Take 1 tablet (500 mg total) by mouth daily.        Discharge Assessment: Vitals:   01/21/21 0415 01/21/21 1249  BP: 130/70 121/63  Pulse: 73 88  Resp: 16 18  Temp: 98.1 F (36.7 C) 98.2 F (36.8 C)  SpO2: 97% 96%   Skin clean, dry and intact without evidence of skin  break down, no evidence of skin tears noted. IV catheter discontinued intact. Site without signs and symptoms of complications - no redness or edema noted at insertion site, patient denies c/o pain - only slight tenderness at site.  Dressing with slight pressure applied.  D/c Instructions-Education: Discharge instructions given to patient/family with verbalized understanding. D/c education completed with patient/family including follow up instructions, medication list, d/c activities limitations if indicated, with other d/c instructions as indicated by MD - patient able to verbalize understanding, all questions fully answered. Patient instructed to return to ED, call 911, or call MD for any changes in condition.  Patient escorted via [name], and D/C home via private auto.  Dorcas Mcmurray, LPN 07/12/5007 3:81 PM

## 2021-01-21 NOTE — Progress Notes (Signed)
Patient had c/o pain beginning of shift to abdomen and back. PRN pain med. Also gave simethicone x2. Patient was able to rest well during the night. No other issues noted at this time.

## 2021-01-21 NOTE — Discharge Summary (Signed)
Physician Discharge Summary  Patient ID: [name] MRN: [id number] DOB/AGE: [birth year] 76 y.o.  Admit date: 01/19/2021 Discharge date: 01/21/2021  Admission Diagnoses: Symptomatic cholelithiasis   Discharge Diagnoses:  Principal Problem:   Cholelithiasis Active Problems:   Symptomatic cholelithiasis   Discharged Condition: good  Hospital Course: [name] is a 76 yo who comes in with abdominal pain and intractable nausea/ vomiting. She underwent a laparoscopic cholecystectomy and had pain following the procedure. She was kept overnight for more pain control and monitoring. She required initiation of flexeril and simethicone for gas pains and cramping.  Prior to her discharge she was ambulating, had adequate pain control and was eating. She was also being treated with antibiotics for a UTI recently and had these continued while she was here. Her cultures were negative and she was not sent home with antibiotics.   Consults:  Hospitalist admission, transferred to surgery after   Significant Diagnostic Studies:   CLINICAL DATA:  Abdominal pain.  Gallstone on recent CT.   EXAM: ULTRASOUND ABDOMEN LIMITED RIGHT UPPER QUADRANT   COMPARISON:  CT of 1 day prior from [facility] rocking ham, report only   FINDINGS: Gallbladder:   Mobile gallstones at up to 9 mm. No wall thickening or pericholecystic fluid. Sonographic Murphy's sign was not elicited.   Probable gallbladder polyp at 8 mm.   Common bile duct:   Diameter: Normal, 4 mm.   Liver:   No focal lesion identified. Within normal limits in parenchymal echogenicity. Portal vein is patent on color Doppler imaging with normal direction of blood flow towards the liver.   Other: None.   IMPRESSION: Cholelithiasis without acute cholecystitis.   8 mm gallbladder polyp. Per consensus criteria, follow-up ultrasound at 1 year is recommended. This recommendation follows ACR consensus guidelines: White Paper of the ACR Incidental  Findings Committee II on Gallbladder and Biliary Findings. J Am Coll Radiol 2013:;10:953-956.     Electronically Signed   By: Abigail Miyamoto M.D.   On: 01/19/2021 11:14  Treatments: IV hydration and laparoscopic cholecystectomy 01/20/2021  Discharge Exam: Blood pressure 121/63, pulse 88, temperature 98.2 F (36.8 C), temperature source Oral, resp. rate 18, height 5\' 2"  (1.575 m), weight 61.7 kg, SpO2 96 %. General appearance: alert and no distress GI: soft, appropriately tender, port sites c/d/I with dermabond, no erythema or drainage  Disposition: Discharge disposition: 01-Home or Self Care       Discharge Instructions     Call MD for:  difficulty breathing, headache or visual disturbances   Complete by: As directed    Call MD for:  extreme fatigue   Complete by: As directed    Call MD for:  persistant dizziness or light-headedness   Complete by: As directed    Call MD for:  persistant nausea and vomiting   Complete by: As directed    Call MD for:  redness, tenderness, or signs of infection (pain, swelling, redness, odor or green/yellow discharge around incision site)   Complete by: As directed    Call MD for:  severe uncontrolled pain   Complete by: As directed    Call MD for:  temperature >100.4   Complete by: As directed    Diet - low sodium heart healthy   Complete by: As directed    Increase activity slowly   Complete by: As directed       Allergies as of 01/21/2021       Reactions   Sulfa Antibiotics Rash   Penicillins Hives  Tolerates Cephalosporin 01/2021    Other Rash   walnut        Medication List     STOP taking these medications    cephALEXin 500 MG capsule Commonly known as: Keflex   predniSONE 10 MG tablet Commonly known as: DELTASONE       TAKE these medications    aspirin EC 81 MG tablet Take 1 tablet (81 mg total) by mouth daily.   cyclobenzaprine 5 MG tablet Commonly known as: FLEXERIL Take 1 tablet (5 mg total) by mouth  3 (three) times daily as needed for muscle spasms.   meloxicam 7.5 MG tablet Commonly known as: MOBIC TAKE 1 TABLET BY MOUTH EVERY DAY   ondansetron 4 MG tablet Commonly known as: ZOFRAN Take 1 tablet (4 mg total) by mouth every 6 (six) hours as needed for nausea.   oxyCODONE 5 MG immediate release tablet Commonly known as: Oxy IR/ROXICODONE Take 1 tablet (5 mg total) by mouth every 4 (four) hours as needed for breakthrough pain or severe pain.   POTASSIUM PO Take 1 tablet by mouth daily.   sennosides-docusate sodium 8.6-50 MG tablet Commonly known as: SENOKOT-S Take 1 tablet by mouth as needed for constipation.   simethicone 80 MG chewable tablet Commonly known as: MYLICON Chew 1 tablet (80 mg total) by mouth 4 (four) times daily as needed for flatulence.   vitamin C 500 MG tablet Commonly known as: ASCORBIC ACID Take 1 tablet (500 mg total) by mouth daily.        Follow-up Information     Virl Cagey, MD Follow up on 02/11/2021.   Specialty: General Surgery Why: post op phone call Contact information: [address] [place] [address] [phone]                 Signed: Virl Cagey 01/21/2021, 2:29 PM

## 2021-02-02 DIAGNOSIS — I700 Atherosclerosis of aorta: Secondary | ICD-10-CM | POA: Diagnosis not present

## 2021-02-02 DIAGNOSIS — D692 Other nonthrombocytopenic purpura: Secondary | ICD-10-CM | POA: Diagnosis not present

## 2021-02-02 DIAGNOSIS — Z9049 Acquired absence of other specified parts of digestive tract: Secondary | ICD-10-CM | POA: Diagnosis not present

## 2021-02-02 DIAGNOSIS — Z09 Encounter for follow-up examination after completed treatment for conditions other than malignant neoplasm: Secondary | ICD-10-CM | POA: Diagnosis not present

## 2021-02-11 ENCOUNTER — Ambulatory Visit (INDEPENDENT_AMBULATORY_CARE_PROVIDER_SITE_OTHER): Payer: Medicare Other | Admitting: General Surgery

## 2021-02-11 DIAGNOSIS — K8020 Calculus of gallbladder without cholecystitis without obstruction: Secondary | ICD-10-CM

## 2021-02-11 NOTE — Progress Notes (Signed)
[facility]  I am calling the patient for post operative evaluation. This is not a billable encounter as it is under the [place] charges for the surgery.  The patient had a laparoscopic cholecystectomy on 01/20/2021. The patient reports that she is doing great. The are tolerating a diet, having good pain control, and having regular Bms. She did have some constipation at first but this is improved. The incisions are healing. The patient has no concerns.   Pathology: FINAL MICROSCOPIC DIAGNOSIS:   A. GALLBLADDER, CHOLECYSTECTOMY:  - Chronic cholecystitis and cholelithiasis   Will see the patient PRN.  Diet and activity as tolerated.   Curlene Labrum, MD [facility] [address] [place], [place] [id number] [phone] (office)

## 2021-05-04 DIAGNOSIS — Z20822 Contact with and (suspected) exposure to covid-19: Secondary | ICD-10-CM | POA: Diagnosis not present

## 2021-05-11 DIAGNOSIS — Z20822 Contact with and (suspected) exposure to covid-19: Secondary | ICD-10-CM | POA: Diagnosis not present

## 2021-09-08 DIAGNOSIS — M8580 Other specified disorders of bone density and structure, unspecified site: Secondary | ICD-10-CM | POA: Diagnosis not present

## 2021-09-08 DIAGNOSIS — E7800 Pure hypercholesterolemia, unspecified: Secondary | ICD-10-CM | POA: Diagnosis not present

## 2021-09-08 DIAGNOSIS — Z7189 Other specified counseling: Secondary | ICD-10-CM | POA: Diagnosis not present

## 2021-09-08 DIAGNOSIS — Z299 Encounter for prophylactic measures, unspecified: Secondary | ICD-10-CM | POA: Diagnosis not present

## 2021-09-08 DIAGNOSIS — Z1331 Encounter for screening for depression: Secondary | ICD-10-CM | POA: Diagnosis not present

## 2021-09-08 DIAGNOSIS — R350 Frequency of micturition: Secondary | ICD-10-CM | POA: Diagnosis not present

## 2021-09-08 DIAGNOSIS — Z0000 Encounter for general adult medical examination without abnormal findings: Secondary | ICD-10-CM | POA: Diagnosis not present

## 2021-09-08 DIAGNOSIS — Z6828 Body mass index (BMI) 28.0-28.9, adult: Secondary | ICD-10-CM | POA: Diagnosis not present

## 2021-09-08 DIAGNOSIS — Z1339 Encounter for screening examination for other mental health and behavioral disorders: Secondary | ICD-10-CM | POA: Diagnosis not present

## 2021-09-08 DIAGNOSIS — R5383 Other fatigue: Secondary | ICD-10-CM | POA: Diagnosis not present

## 2021-09-08 DIAGNOSIS — N390 Urinary tract infection, site not specified: Secondary | ICD-10-CM | POA: Diagnosis not present

## 2021-09-08 DIAGNOSIS — Z79899 Other long term (current) drug therapy: Secondary | ICD-10-CM | POA: Diagnosis not present

## 2021-09-21 ENCOUNTER — Telehealth: Payer: Self-pay | Admitting: *Deleted

## 2021-09-21 NOTE — Chronic Care Management (AMB) (Signed)
  Care Coordination   Note   09/21/2021 Name: [name] MRN: [id number] DOB: 1945/02/02  [name] is a 76 y.o. year old female who sees Vyas, Costella Hatcher, MD for primary care. I reached out to [employer] by phone today to offer care coordination services.  [name] was given information about Care Coordination services today including:   The Care Coordination services include support from the care team which includes your Nurse Coordinator, Clinical Social Worker, or Pharmacist.  The Care Coordination team is here to help remove barriers to the health concerns and goals most important to you. Care Coordination services are voluntary, and the patient may decline or stop services at any time by request to their care team member.   Care Coordination Consent Status: Patient agreed to services and verbal consent obtained.   Follow up plan:  Telephone appointment with care coordination team member scheduled for:  09/27/21  Encounter Outcome:  Pt. Scheduled  [place]  Direct Dial: [phone]

## 2021-09-27 ENCOUNTER — Encounter: Payer: Self-pay | Admitting: *Deleted

## 2021-09-27 ENCOUNTER — Ambulatory Visit: Payer: Self-pay | Admitting: *Deleted

## 2021-09-27 NOTE — Patient Instructions (Signed)
Visit Information  Thank you for taking time to visit with me today. Please don't hesitate to contact me if I can be of assistance to you.  Following are the goals we discussed today:  Call to schedule mammogram. Exercise 2-3 days a week.  Please call the Suicide and Crisis Lifeline: 988 call the [place] National Suicide Prevention Lifeline: 321-460-8615 or TTY: (281)127-0519 TTY 325-847-2575) to talk to a trained counselor call 1-800-273-TALK (toll free, 24 hour hotline) call the [facility]: (531) [phone] call 911 if you are experiencing a Mental Health or [place] or need someone to talk to.  Patient verbalizes understanding of instructions and care plan provided today and agrees to view in [place]. Active MyChart status and patient understanding of how to access instructions and care plan via MyChart confirmed with patient.     The patient has been provided with contact information for the care management team and has been advised to call with any health related questions or concerns.   Valente David, RN, MSN, [place] Care Management Care Management Coordinator [phone]

## 2021-09-27 NOTE — Patient Outreach (Signed)
  Care Coordination   Initial Visit Note   09/27/2021 Name: [name] MRN: [id number] DOB: August 29, 1945  [name] is a 76 y.o. year old female who sees Vyas, Costella Hatcher, MD for primary care. I spoke with  [name] by phone today.  What matters to the patients health and wellness today?  Member report she is managing health well, recently retired from work.  Goal is to increase exercise regime.  Denies any urgent concerns or need for follow up at this time, encouraged to contact this care manager with questions.      Goals Addressed             This Visit's Progress    COMPLETED: Care Coordination Activities - No follow up needed       Care Coordination Interventions: Patient interviewed about adult health maintenance status including  Colonoscopy    Regular eye checkups Regular Dental Care    Blood Pressure    Mammogram Advised patient to discuss  Pneumonia Vaccine Influenza Vaccine COVID vaccination    with primary care provider  Provided education about Need for AWV yearly, state last was on 8/31 SDOH assessment completed         SDOH assessments and interventions completed:  Yes  SDOH Interventions Today    Flowsheet Row Most Recent Value  SDOH Interventions   Food Insecurity Interventions Intervention Not Indicated  Housing Interventions Intervention Not Indicated  Transportation Interventions Intervention Not Indicated  Utilities Interventions Intervention Not Indicated  Financial Strain Interventions Intervention Not Indicated        Care Coordination Interventions Activated:  Yes  Care Coordination Interventions:  Yes, provided   Follow up plan: No further intervention required.   Encounter Outcome:  Pt. Visit Completed   Valente David, RN, MSN, [place] Care Management Care Management Coordinator [phone]

## 2021-09-30 DIAGNOSIS — Z1231 Encounter for screening mammogram for malignant neoplasm of breast: Secondary | ICD-10-CM | POA: Diagnosis not present

## 2021-09-30 DIAGNOSIS — M859 Disorder of bone density and structure, unspecified: Secondary | ICD-10-CM | POA: Diagnosis not present

## 2021-09-30 DIAGNOSIS — Z79899 Other long term (current) drug therapy: Secondary | ICD-10-CM | POA: Diagnosis not present

## 2021-09-30 DIAGNOSIS — E2839 Other primary ovarian failure: Secondary | ICD-10-CM | POA: Diagnosis not present

## 2021-11-09 DIAGNOSIS — D485 Neoplasm of uncertain behavior of skin: Secondary | ICD-10-CM | POA: Diagnosis not present

## 2021-11-09 DIAGNOSIS — C44329 Squamous cell carcinoma of skin of other parts of face: Secondary | ICD-10-CM | POA: Diagnosis not present

## 2021-12-07 DIAGNOSIS — D0439 Carcinoma in situ of skin of other parts of face: Secondary | ICD-10-CM | POA: Diagnosis not present

## 2021-12-27 DIAGNOSIS — Z23 Encounter for immunization: Secondary | ICD-10-CM | POA: Diagnosis not present

## 2022-03-07 DIAGNOSIS — H2513 Age-related nuclear cataract, bilateral: Secondary | ICD-10-CM | POA: Diagnosis not present

## 2022-03-21 DIAGNOSIS — Z01818 Encounter for other preprocedural examination: Secondary | ICD-10-CM | POA: Diagnosis not present

## 2022-03-21 DIAGNOSIS — H2512 Age-related nuclear cataract, left eye: Secondary | ICD-10-CM | POA: Diagnosis not present

## 2022-03-21 DIAGNOSIS — H2511 Age-related nuclear cataract, right eye: Secondary | ICD-10-CM | POA: Diagnosis not present

## 2022-03-29 DIAGNOSIS — N8110 Cystocele, unspecified: Secondary | ICD-10-CM | POA: Diagnosis not present

## 2022-03-29 DIAGNOSIS — N399 Disorder of urinary system, unspecified: Secondary | ICD-10-CM | POA: Diagnosis not present

## 2022-03-29 DIAGNOSIS — B3731 Acute candidiasis of vulva and vagina: Secondary | ICD-10-CM | POA: Diagnosis not present

## 2022-03-29 DIAGNOSIS — Z299 Encounter for prophylactic measures, unspecified: Secondary | ICD-10-CM | POA: Diagnosis not present

## 2022-03-29 DIAGNOSIS — I700 Atherosclerosis of aorta: Secondary | ICD-10-CM | POA: Diagnosis not present

## 2022-04-13 DIAGNOSIS — H269 Unspecified cataract: Secondary | ICD-10-CM | POA: Diagnosis not present

## 2022-04-13 DIAGNOSIS — H2511 Age-related nuclear cataract, right eye: Secondary | ICD-10-CM | POA: Diagnosis not present

## 2022-05-02 DIAGNOSIS — N819 Female genital prolapse, unspecified: Secondary | ICD-10-CM | POA: Diagnosis not present

## 2022-05-02 DIAGNOSIS — N8110 Cystocele, unspecified: Secondary | ICD-10-CM | POA: Diagnosis not present

## 2022-05-02 DIAGNOSIS — N816 Rectocele: Secondary | ICD-10-CM | POA: Diagnosis not present

## 2022-05-02 DIAGNOSIS — N952 Postmenopausal atrophic vaginitis: Secondary | ICD-10-CM | POA: Diagnosis not present

## 2022-05-11 DIAGNOSIS — H2512 Age-related nuclear cataract, left eye: Secondary | ICD-10-CM | POA: Diagnosis not present

## 2022-05-11 DIAGNOSIS — H2513 Age-related nuclear cataract, bilateral: Secondary | ICD-10-CM | POA: Diagnosis not present

## 2022-05-11 DIAGNOSIS — H269 Unspecified cataract: Secondary | ICD-10-CM | POA: Diagnosis not present

## 2022-05-16 ENCOUNTER — Other Ambulatory Visit (INDEPENDENT_AMBULATORY_CARE_PROVIDER_SITE_OTHER): Payer: Medicare Other

## 2022-05-16 ENCOUNTER — Ambulatory Visit (INDEPENDENT_AMBULATORY_CARE_PROVIDER_SITE_OTHER): Payer: Medicare Other | Admitting: Physician Assistant

## 2022-05-16 ENCOUNTER — Encounter: Payer: Self-pay | Admitting: Physician Assistant

## 2022-05-16 DIAGNOSIS — M1711 Unilateral primary osteoarthritis, right knee: Secondary | ICD-10-CM

## 2022-05-16 MED ORDER — HYDROCODONE-ACETAMINOPHEN 5-325 MG PO TABS: 1.0000 | ORAL_TABLET | Freq: Every day | ORAL | 0 refills | Status: AC | PRN

## 2022-05-16 NOTE — Progress Notes (Signed)
Office Visit Note   Patient: [name]           Date of Birth: December 06, 1945           MRN: [id number] Visit Date: 05/16/2022              Requested by: Ignatius Specking, MD [address] [place],  [place] [address] PCP: Ignatius Specking, MD   Assessment & Plan: Visit Diagnoses:  1. Primary osteoarthritis of right knee     Plan: Impression is right knee osteoarthritis.  Today, we discussed various treatment options to include intra-articular cortisone injection for which she would like to proceed.  We have also discussed viscosupplementation injection if she fails to have improvement following the cortisone injection.  She will let us know.  Otherwise, follow-up as needed.  Follow-Up Instructions: Return if symptoms worsen or fail to improve.   Orders:  Orders Placed This Encounter  Procedures   XR KNEE 3 VIEW RIGHT   Meds ordered this encounter  Medications   HYDROcodone-acetaminophen (NORCO) 5-325 MG tablet    Sig: Take 1 tablet by mouth daily as needed.    Dispense:  10 tablet    Refill:  0      Procedures: No procedures performed   Clinical Data: No additional findings.   Subjective: Chief Complaint  Patient presents with   Right Knee - Pain    HPI patient is a pleasant 77 year old female who comes in today with right knee pain for the past month which has progressively worsened.  She denies any injury or change in activity.  The pain she has is to the medial aspect and is described as a constant tooth ache.  Pain is worse with walking as well as at night.  She has tried oral and topical NSAIDs as well as Tylenol without relief.  No previous cortisone injection to the right knee.  Review of Systems as detailed in HPI.  All others reviewed and are negative.   Objective: Vital Signs: There were no vitals taken for this visit.  Physical Exam well-developed well-nourished female no acute distress.  Alert and oriented x 3.  Ortho Exam right knee exam shows no effusion.   Range of motion 0 to 120 degrees.  Marked tenderness medial joint line.  Mild patellofemoral crepitus.  Ligaments are stable.  She is neurovascularly intact distally.  Specialty Comments:  No specialty comments available.  Imaging: XR KNEE 3 VIEW RIGHT  Result Date: 05/16/2022 X-rays demonstrate moderate degenerative changes to the right knee medial and patellofemoral compartments    PMFS History: Patient Active Problem List   Diagnosis Date Noted   Symptomatic cholelithiasis 01/20/2021   Cholelithiasis 01/19/2021   Closed left hip fracture (HCC) 07/02/2015   Anemia, unspecified 07/02/2015   Past Medical History:  Diagnosis Date   Distal radius fracture, right    mechanical fall   Hip fracture, left (HCC)    2017   Hyperglycemia    Kidney stone    X 2   UTI (lower urinary tract infection)    06/2015    Family History  Problem Relation Age of Onset   Cancer Father        cns   Diabetes Neg Hx    Heart disease Neg Hx    Stroke Neg Hx     Past Surgical History:  Procedure Laterality Date   CARDIAC CATHETERIZATION  2005   [facility]-report in echart-not in epic   CERVICAL FUSION  2006   CHOLECYSTECTOMY N/A 01/20/2021   Procedure: LAPAROSCOPIC CHOLECYSTECTOMY;  Surgeon: Lucretia Roers, MD;  Location: [facility];  Service: General;  Laterality: N/A;   COLONOSCOPY     COLONOSCOPY N/A 08/19/2014   Procedure: COLONOSCOPY;  Surgeon: Malissa Hippo, MD;  [facility]: [facility];  Service: Endoscopy;  Laterality: N/A;  830   FINGER ARTHROPLASTY  9/13   lt long and index   HEMORRHOID SURGERY     INTRAMEDULLARY (IM) NAIL INTERTROCHANTERIC Left 06/14/2015   Procedure: INTRAMEDULLARY (IM) NAIL INTERTROCHANTRIC;  Surgeon: Nadara Mustard, MD;  Location: [facility];  Service: Orthopedics;  Laterality: Left;   OPEN REDUCTION INTERNAL FIXATION (ORIF) DISTAL RADIAL FRACTURE Right 06/25/2014   Procedure: OPEN REDUCTION INTERNAL FIXATION (ORIF) RIGHT  DISTAL RADIUS FRACTURE;  Surgeon: Betha Loa,  MD;  Location: [facility];  Service: Orthopedics;  Laterality: Right;   TENOLYSIS Left 05/02/2012   Procedure: LEFT LONG TENOLYSIS CAPSULE RELEASES MANIPULATION OF IP/MP JOINTS;  Surgeon: Wyn Forster., MD;  Location: [facility];  Service: Orthopedics;  Laterality: Left;   TUBAL LIGATION     Social History   Occupational History   Not on file  Tobacco Use   Smoking status: Former    Types: Cigarettes    Quit date: 10/03/1986    Years since quitting: 35.6   Smokeless tobacco: Never  Vaping Use   Vaping Use: Never used  Substance and Sexual Activity   Alcohol use: No   Drug use: No   Sexual activity: Not on file

## 2022-05-19 ENCOUNTER — Telehealth: Payer: Self-pay | Admitting: Physician Assistant

## 2022-05-19 NOTE — Telephone Encounter (Signed)
Patient called, says she is in a lot of pain. Shot did not help. Would like a call back. [phone]

## 2022-05-19 NOTE — Telephone Encounter (Signed)
Spoke with patient. Explained that sometimes patient's experience more discomfort for a couple days after injection, and it can take up to 2 weeks before we know if cortisone injection will help. She will call us back if no improvement 2 weeks after injection.

## 2022-06-06 ENCOUNTER — Telehealth: Payer: Self-pay | Admitting: Physician Assistant

## 2022-06-06 NOTE — Telephone Encounter (Signed)
Patient called asked if she can get the gel injection? Patient said her knee felt better for about 1 week and now it's giving her a fit. The number to contact patient is [phone]

## 2022-06-07 NOTE — Telephone Encounter (Signed)
VOB submitted for Monovisc, right knee  

## 2022-07-06 ENCOUNTER — Telehealth (HOSPITAL_BASED_OUTPATIENT_CLINIC_OR_DEPARTMENT_OTHER): Payer: Self-pay

## 2022-07-06 NOTE — Telephone Encounter (Signed)
Lvm for pt to cb to schedule

## 2022-07-06 NOTE — Telephone Encounter (Signed)
Approved for Monovisc-right knee B&B No copay Pt Covered at 100% by 2nd insurance No prior auth required

## 2022-07-18 ENCOUNTER — Encounter: Payer: Self-pay | Admitting: Physician Assistant

## 2022-07-18 ENCOUNTER — Ambulatory Visit (INDEPENDENT_AMBULATORY_CARE_PROVIDER_SITE_OTHER): Payer: Medicare Other | Admitting: Physician Assistant

## 2022-07-18 DIAGNOSIS — M1711 Unilateral primary osteoarthritis, right knee: Secondary | ICD-10-CM

## 2022-07-18 MED ORDER — LIDOCAINE HCL 1 % IJ SOLN
2.0000 mL | INTRAMUSCULAR | Status: AC | PRN
  Administered 2022-07-18: 2 mL

## 2022-07-18 MED ORDER — BUPIVACAINE HCL 0.25 % IJ SOLN
2.0000 mL | INTRAMUSCULAR | Status: AC | PRN
  Administered 2022-07-18: 2 mL via INTRA_ARTICULAR

## 2022-07-18 MED ORDER — HYALURONAN 88 MG/4ML IX SOSY
88.0000 mg | PREFILLED_SYRINGE | INTRA_ARTICULAR | Status: AC | PRN
  Administered 2022-07-18: 88 mg via INTRA_ARTICULAR

## 2022-07-18 NOTE — Progress Notes (Signed)
Office Visit Note   Patient: [name]           Date of Birth: 1945-01-31           MRN: [id number] Visit Date: 07/18/2022              Requested by: Ignatius Specking, MD [address] [place],  [place] [address] PCP: Ignatius Specking, MD   Assessment & Plan: Visit Diagnoses:  1. Primary osteoarthritis of right knee     Plan: Impression is right knee osteoarthritis.  Today, proceed with right knee Monovisc injection.  She tolerated this well.  She will follow-up with [place] as needed.  Follow-Up Instructions: Return if symptoms worsen or fail to improve.   Orders:  Orders Placed This Encounter  Procedures   Large Joint Inj   No orders of the defined types were placed in this encounter.     Procedures: Large Joint Inj: R knee on 07/18/2022 10:14 AM Indications: pain Details: 22 G needle, anterolateral approach Medications: 2 mL lidocaine 1 %; 2 mL bupivacaine 0.25 %; 88 mg Hyaluronan 88 MG/4ML      Clinical Data: No additional findings.   Subjective: Chief Complaint  Patient presents with   Right Knee - Follow-up    monovisc    HPI patient is a pleasant 77 year old female who comes in today for Monovisc injection to the right knee.  History of underlying osteoarthritis.  Previous cortisone injections have not provided relief.     Objective: Vital Signs: There were no vitals taken for this visit.    Ortho Exam stable right knee exam  Specialty Comments:  No specialty comments available.  Imaging: No new imaging   PMFS History: Patient Active Problem List   Diagnosis Date Noted   Symptomatic cholelithiasis 01/20/2021   Cholelithiasis 01/19/2021   Closed left hip fracture (HCC) 07/02/2015   Anemia, unspecified 07/02/2015   Past Medical History:  Diagnosis Date   Distal radius fracture, right    mechanical fall   Hip fracture, left (HCC)    2017   Hyperglycemia    Kidney stone    X 2   UTI (lower urinary tract infection)    06/2015    Family  History  Problem Relation Age of Onset   Cancer Father        cns   Diabetes Neg Hx    Heart disease Neg Hx    Stroke Neg Hx     Past Surgical History:  Procedure Laterality Date   CARDIAC CATHETERIZATION  2005   [facility]-report in echart-not in epic   CERVICAL FUSION  2006   CHOLECYSTECTOMY N/A 01/20/2021   Procedure: LAPAROSCOPIC CHOLECYSTECTOMY;  Surgeon: Lucretia Roers, MD;  Location: [facility];  Service: General;  Laterality: N/A;   COLONOSCOPY     COLONOSCOPY N/A 08/19/2014   Procedure: COLONOSCOPY;  Surgeon: Malissa Hippo, MD;  [facility];  Service: Endoscopy;  Laterality: N/A;  830   FINGER ARTHROPLASTY  9/13   lt long and index   HEMORRHOID SURGERY     INTRAMEDULLARY (IM) NAIL INTERTROCHANTERIC Left 06/14/2015   Procedure: INTRAMEDULLARY (IM) NAIL INTERTROCHANTRIC;  Surgeon: Nadara Mustard, MD;  Location: [facility];  Service: Orthopedics;  Laterality: Left;   OPEN REDUCTION INTERNAL FIXATION (ORIF) DISTAL RADIAL FRACTURE Right 06/25/2014   Procedure: OPEN REDUCTION INTERNAL FIXATION (ORIF) RIGHT  DISTAL RADIUS FRACTURE;  Surgeon: Betha Loa, MD;  Location: [facility];  Service: Orthopedics;  Laterality: Right;   TENOLYSIS Left 05/02/2012   Procedure: LEFT LONG TENOLYSIS CAPSULE RELEASES MANIPULATION OF IP/MP JOINTS;  Surgeon: Wyn Forster., MD;  Location: [facility];  Service: Orthopedics;  Laterality: Left;   TUBAL LIGATION     Social History   Occupational History   Not on file  Tobacco Use   Smoking status: Former    Types: Cigarettes    Quit date: 10/03/1986    Years since quitting: 35.8   Smokeless tobacco: Never  Vaping Use   Vaping Use: Never used  Substance and Sexual Activity   Alcohol use: No   Drug use: No   Sexual activity: Not on file

## 2022-07-26 ENCOUNTER — Other Ambulatory Visit: Payer: Self-pay | Admitting: Physician Assistant

## 2022-07-26 ENCOUNTER — Telehealth: Payer: Self-pay | Admitting: Physician Assistant

## 2022-07-26 NOTE — Telephone Encounter (Signed)
We cannot refill narcotic.  Happy to send in tramadol or tylenol 3

## 2022-07-26 NOTE — Telephone Encounter (Signed)
Patient called needing Rx refilled Hydrocodone. The number to contact patient is [phone]

## 2022-07-26 NOTE — Telephone Encounter (Signed)
Called. No answer. Left message that we cannot refill narcotics, but could try tramadol or tylenol 3 if she would like.

## 2022-09-03 ENCOUNTER — Other Ambulatory Visit: Payer: Self-pay

## 2022-09-03 ENCOUNTER — Emergency Department (HOSPITAL_COMMUNITY)
Admission: EM | Admit: 2022-09-03 | Discharge: 2022-09-03 | Disposition: A | Discharge status: Home / Self Care | Payer: Medicare Other | Attending: Emergency Medicine | Admitting: Emergency Medicine

## 2022-09-03 DIAGNOSIS — M549 Dorsalgia, unspecified: Secondary | ICD-10-CM | POA: Diagnosis not present

## 2022-09-03 DIAGNOSIS — Z7982 Long term (current) use of aspirin: Secondary | ICD-10-CM | POA: Diagnosis not present

## 2022-09-03 DIAGNOSIS — R21 Rash and other nonspecific skin eruption: Secondary | ICD-10-CM | POA: Diagnosis present

## 2022-09-03 DIAGNOSIS — B029 Zoster without complications: Secondary | ICD-10-CM | POA: Diagnosis not present

## 2022-09-03 MED ORDER — GABAPENTIN 100 MG PO CAPS: 100.0000 mg | ORAL_CAPSULE | Freq: Three times a day (TID) | ORAL | 0 refills | Status: AC

## 2022-09-03 MED ORDER — VALACYCLOVIR HCL 500 MG PO TABS
1000.0000 mg | ORAL_TABLET | Freq: Once | ORAL | Status: AC
  Administered 2022-09-03: 1000 mg via ORAL
  Filled 2022-09-03: qty 2

## 2022-09-03 MED ORDER — GABAPENTIN 100 MG PO CAPS
100.0000 mg | ORAL_CAPSULE | Freq: Once | ORAL | Status: AC
  Administered 2022-09-03: 100 mg via ORAL
  Filled 2022-09-03: qty 1

## 2022-09-03 MED ORDER — PREDNISONE 20 MG PO TABS: 60.0000 mg | ORAL_TABLET | Freq: Every day | ORAL | 0 refills | Status: AC

## 2022-09-03 MED ORDER — VALACYCLOVIR HCL 1 G PO TABS: 1000.0000 mg | ORAL_TABLET | Freq: Three times a day (TID) | ORAL | 0 refills | Status: AC

## 2022-09-03 MED ORDER — PREDNISONE 50 MG PO TABS
60.0000 mg | ORAL_TABLET | ORAL | Status: AC
  Administered 2022-09-03: 60 mg via ORAL
  Filled 2022-09-03: qty 1

## 2022-09-03 NOTE — ED Triage Notes (Signed)
Pt was cleaning her garage yesterday and now she has blisters / rash under her right side breast around to her back. Pt states it does not itch or burn.

## 2022-09-03 NOTE — ED Provider Notes (Signed)
[place] EMERGENCY DEPARTMENT AT [facility] Provider Note   CSN: [id number] Arrival date & time: 09/03/22  6644     History  Chief Complaint  Patient presents with   Rash    [name] is a 77 y.o. female.  HPI Patient presents with painful rash.  She notes that she had a few days of mild pain in the right mid thoracic back, but over the past days developed rash in the dermatome roughly T5 including lesions underneath her right breast, and in the axilla.  No other complaints including fever, chest pain, dyspnea, abdominal pain. No relief with ibuprofen.  She has not received her shingles vaccine.    Home Medications Prior to Admission medications   Medication Sig Start Date End Date Taking? Authorizing Provider  gabapentin (NEURONTIN) 100 MG capsule Take 1 capsule (100 mg total) by mouth 3 (three) times daily for 14 days. 09/03/22 09/17/22 Yes Gerhard Munch, MD  predniSONE (DELTASONE) 20 MG tablet Take 3 tablets (60 mg total) by mouth daily with breakfast for 7 days. For the next four days 09/03/22 09/10/22 Yes Gerhard Munch, MD  valACYclovir (VALTREX) 1000 MG tablet Take 1 tablet (1,000 mg total) by mouth 3 (three) times daily. 09/03/22  Yes Gerhard Munch, MD  aspirin EC 81 MG tablet Take 1 tablet (81 mg total) by mouth daily. 06/14/15   Nadara Mustard, MD  cyclobenzaprine (FLEXERIL) 5 MG tablet Take 1 tablet (5 mg total) by mouth 3 (three) times daily as needed for muscle spasms. 01/21/21   Lucretia Roers, MD  HYDROcodone-acetaminophen (NORCO) 5-325 MG tablet Take 1 tablet by mouth daily as needed. 05/16/22   Cristie Hem, PA-C  meloxicam (MOBIC) 7.5 MG tablet TAKE 1 TABLET BY MOUTH EVERY DAY Patient taking differently: Take 7.5 mg by mouth daily. 12/24/19   Persons, [place], PA  ondansetron (ZOFRAN) 4 MG tablet Take 1 tablet (4 mg total) by mouth every 6 (six) hours as needed for nausea. 01/21/21   Lucretia Roers, MD  oxyCODONE (OXY IR/ROXICODONE) 5 MG  immediate release tablet Take 1 tablet (5 mg total) by mouth every 4 (four) hours as needed for breakthrough pain or severe pain. 01/21/21   Lucretia Roers, MD  POTASSIUM PO Take 1 tablet by mouth daily.    [provider]  sennosides-docusate sodium (SENOKOT-S) 8.6-50 MG tablet Take 1 tablet by mouth as needed for constipation.    [provider]  simethicone (MYLICON) 80 MG chewable tablet Chew 1 tablet (80 mg total) by mouth 4 (four) times daily as needed for flatulence. 01/21/21   Lucretia Roers, MD  vitamin C (ASCORBIC ACID) 500 MG tablet Take 1 tablet (500 mg total) by mouth daily. 06/25/14   Betha Loa, MD      Allergies    Sulfa antibiotics, Penicillins, and Other    Review of Systems   Review of Systems  All other systems reviewed and are negative.   Physical Exam Updated Vital Signs BP (!) 149/77 (BP Location: Right Arm)   Pulse 78   Temp 97.9 F (36.6 C) (Oral)   Resp 17   Ht 5\' 5"  (1.651 m)   Wt 77.1 kg   SpO2 99%   BMI 28.29 kg/m  Physical Exam Vitals and nursing note reviewed.  Constitutional:      General: She is not in acute distress.    Appearance: She is well-developed.  HENT:     Head: Normocephalic and atraumatic.  Eyes:     Conjunctiva/sclera: Conjunctivae normal.  Pulmonary:     Effort: Pulmonary effort is normal. No respiratory distress.     Breath sounds: No stridor.  Abdominal:     General: There is no distension.  Skin:    General: Skin is warm and dry.       Neurological:     Mental Status: She is alert and oriented to person, place, and time.     Cranial Nerves: No cranial nerve deficit.  Psychiatric:        Mood and Affect: Mood normal.     ED Results / Procedures / Treatments   Labs (all labs ordered are listed, but only abnormal results are displayed) Labs Reviewed - No data to display  EKG None  Radiology No results found.  Procedures Procedures    Medications Ordered in ED Medications   valACYclovir (VALTREX) tablet 1,000 mg (has no administration in time range)  predniSONE (DELTASONE) tablet 60 mg (has no administration in time range)  gabapentin (NEURONTIN) capsule 100 mg (has no administration in time range)    ED Course/ Medical Decision Making/ A&P                                 Medical Decision Making Adult female presents with painful rash.  [place] includes shingles versus cellulitis, with consideration of bacteremia, sepsis given her pain, age, risk profile.  No evidence for disseminated zoster, bacteremia, sepsis.  Patient is awake, alert, will be started on pain medication, appropriate antivirals, steroids, can follow-up with primary care.  Amount and/or Complexity of Data Reviewed External Data Reviewed: notes.  Risk Prescription drug management.  Final Clinical Impression(s) / ED Diagnoses Final diagnoses:  Herpes zoster without complication    Rx / DC Orders ED Discharge Orders          Ordered    gabapentin (NEURONTIN) 100 MG capsule  3 times daily        09/03/22 1046    predniSONE (DELTASONE) 20 MG tablet  Daily with breakfast        09/03/22 1046    valACYclovir (VALTREX) 1000 MG tablet  3 times daily        09/03/22 1046              Gerhard Munch, MD 09/03/22 1048

## 2022-09-03 NOTE — Discharge Instructions (Addendum)
Follow-up with your physician or return here for concerning changes in your condition.

## 2022-09-05 DIAGNOSIS — Z299 Encounter for prophylactic measures, unspecified: Secondary | ICD-10-CM | POA: Diagnosis not present

## 2022-09-05 DIAGNOSIS — I739 Peripheral vascular disease, unspecified: Secondary | ICD-10-CM | POA: Diagnosis not present

## 2022-09-05 DIAGNOSIS — B029 Zoster without complications: Secondary | ICD-10-CM | POA: Diagnosis not present

## 2022-09-05 DIAGNOSIS — I700 Atherosclerosis of aorta: Secondary | ICD-10-CM | POA: Diagnosis not present

## 2022-10-19 DIAGNOSIS — Z7189 Other specified counseling: Secondary | ICD-10-CM | POA: Diagnosis not present

## 2022-10-19 DIAGNOSIS — Z1331 Encounter for screening for depression: Secondary | ICD-10-CM | POA: Diagnosis not present

## 2022-10-19 DIAGNOSIS — Z23 Encounter for immunization: Secondary | ICD-10-CM | POA: Diagnosis not present

## 2022-10-19 DIAGNOSIS — Z299 Encounter for prophylactic measures, unspecified: Secondary | ICD-10-CM | POA: Diagnosis not present

## 2022-10-19 DIAGNOSIS — E7800 Pure hypercholesterolemia, unspecified: Secondary | ICD-10-CM | POA: Diagnosis not present

## 2022-10-19 DIAGNOSIS — Z79899 Other long term (current) drug therapy: Secondary | ICD-10-CM | POA: Diagnosis not present

## 2022-10-19 DIAGNOSIS — R5383 Other fatigue: Secondary | ICD-10-CM | POA: Diagnosis not present

## 2022-10-19 DIAGNOSIS — Z0000 Encounter for general adult medical examination without abnormal findings: Secondary | ICD-10-CM | POA: Diagnosis not present

## 2022-10-19 DIAGNOSIS — Z1339 Encounter for screening examination for other mental health and behavioral disorders: Secondary | ICD-10-CM | POA: Diagnosis not present

## 2022-10-20 DIAGNOSIS — M8580 Other specified disorders of bone density and structure, unspecified site: Secondary | ICD-10-CM | POA: Diagnosis not present

## 2022-10-20 DIAGNOSIS — E7800 Pure hypercholesterolemia, unspecified: Secondary | ICD-10-CM | POA: Diagnosis not present

## 2022-10-20 DIAGNOSIS — R5383 Other fatigue: Secondary | ICD-10-CM | POA: Diagnosis not present

## 2022-10-20 DIAGNOSIS — Z79899 Other long term (current) drug therapy: Secondary | ICD-10-CM | POA: Diagnosis not present

## 2022-11-06 DIAGNOSIS — Z1231 Encounter for screening mammogram for malignant neoplasm of breast: Secondary | ICD-10-CM | POA: Diagnosis not present

## 2022-11-13 DIAGNOSIS — D2271 Melanocytic nevi of right lower limb, including hip: Secondary | ICD-10-CM | POA: Diagnosis not present

## 2022-11-13 DIAGNOSIS — L821 Other seborrheic keratosis: Secondary | ICD-10-CM | POA: Diagnosis not present

## 2022-11-13 DIAGNOSIS — Z08 Encounter for follow-up examination after completed treatment for malignant neoplasm: Secondary | ICD-10-CM | POA: Diagnosis not present

## 2022-11-13 DIAGNOSIS — Z86007 Personal history of in-situ neoplasm of skin: Secondary | ICD-10-CM | POA: Diagnosis not present

## 2022-11-13 DIAGNOSIS — D225 Melanocytic nevi of trunk: Secondary | ICD-10-CM | POA: Diagnosis not present

## 2022-11-13 DIAGNOSIS — D1723 Benign lipomatous neoplasm of skin and subcutaneous tissue of right leg: Secondary | ICD-10-CM | POA: Diagnosis not present

## 2022-11-13 DIAGNOSIS — L814 Other melanin hyperpigmentation: Secondary | ICD-10-CM | POA: Diagnosis not present

## 2022-11-13 DIAGNOSIS — D2272 Melanocytic nevi of left lower limb, including hip: Secondary | ICD-10-CM | POA: Diagnosis not present

## 2022-12-13 ENCOUNTER — Ambulatory Visit: Payer: Medicare Other | Admitting: Orthopaedic Surgery

## 2022-12-13 DIAGNOSIS — M1711 Unilateral primary osteoarthritis, right knee: Secondary | ICD-10-CM

## 2022-12-13 MED ORDER — METHYLPREDNISOLONE ACETATE 40 MG/ML IJ SUSP
40.0000 mg | INTRAMUSCULAR | Status: AC | PRN
  Administered 2022-12-13: 40 mg via INTRA_ARTICULAR

## 2022-12-13 MED ORDER — MELOXICAM 7.5 MG PO TABS: 7.5000 mg | ORAL_TABLET | Freq: Every day | ORAL | 2 refills | Status: AC | PRN

## 2022-12-13 MED ORDER — LIDOCAINE HCL 1 % IJ SOLN
2.0000 mL | INTRAMUSCULAR | Status: AC | PRN
  Administered 2022-12-13: 2 mL

## 2022-12-13 MED ORDER — BUPIVACAINE HCL 0.5 % IJ SOLN
2.0000 mL | INTRAMUSCULAR | Status: AC | PRN
  Administered 2022-12-13: 2 mL via INTRA_ARTICULAR

## 2022-12-13 MED ORDER — HYDROCODONE-ACETAMINOPHEN 5-325 MG PO TABS: ORAL_TABLET | ORAL | 0 refills | Status: AC

## 2022-12-13 NOTE — Progress Notes (Signed)
Office Visit Note   Patient: [name]           Date of Birth: 25-Jan-1945           MRN: [id number] Visit Date: 12/13/2022              Requested by: Ignatius Specking, MD [address] [place],  [place] [address] PCP: Ignatius Specking, MD   Assessment & Plan: Visit Diagnoses:  1. Primary osteoarthritis of right knee     Plan: Impression is right knee osteoarthritis.  We have discussed various treatment options to include repeat cortisone injection for which she would like to proceed.  She is not interested in repeating gel injection at this time.  We have provided her with total knee replacement handout.  She will think about her options.  Follow-up as needed.  Follow-Up Instructions: Return if symptoms worsen or fail to improve.   Orders:  No orders of the defined types were placed in this encounter.  Meds ordered this encounter  Medications   HYDROcodone-acetaminophen (NORCO) 5-325 MG tablet    Sig: Take 1/2-1 pill daily prn pain    Dispense:  10 tablet    Refill:  0   meloxicam (MOBIC) 7.5 MG tablet    Sig: Take 1 tablet (7.5 mg total) by mouth daily as needed for pain.    Dispense:  30 tablet    Refill:  2      Procedures: Large Joint Inj: R knee on 12/13/2022 9:50 AM Indications: pain Details: 22 G needle  Arthrogram: No  Medications: 40 mg methylPREDNISolone acetate 40 MG/ML; 2 mL lidocaine 1 %; 2 mL bupivacaine 0.5 % Consent was given by the patient. Patient was prepped and draped in the usual sterile fashion.       Clinical Data: No additional findings.   Subjective: Chief Complaint  Patient presents with   Right Knee - Pain    HPI patient is a 77 year old female who comes in today with recurrent right knee pain.  History of underlying medial patellofemoral compartment OA.  She was seen in our office in May of this past year where cortisone injection was performed.  Minimal relief from the injection.  She was seen again in July where gel injection was  performed.  Minimal to no relief there.  She continues to complain of pain to the medial knee.  Symptoms are worse when she is sleeping with her knees together as well as with flexion of the knee and going from a seated to standing position.  She denies mechanical symptoms.  She has been taking ibuprofen without relief.  She does have associated paresthesias to the bottom of both feet.  No history of neuropathy or lumbar issues.  Review of Systems as detailed in HPI.  All others reviewed and are negative.   Objective: Vital Signs: There were no vitals taken for this visit.  Physical Exam well-developed and well-nourished female in no acute distress.  Alert and oriented x 3.  Ortho Exam right knee exam: No effusion.  Range of motion 0 to 115 degrees.  Medial joint line tenderness.  No patellofemoral crepitus.  She is neurovascular intact distally.  Specialty Comments:  No specialty comments available.  Imaging: No new imaging   PMFS History: Patient Active Problem List   Diagnosis Date Noted   Symptomatic cholelithiasis 01/20/2021   Cholelithiasis 01/19/2021   Closed left hip fracture (HCC) 07/02/2015   Anemia, unspecified 07/02/2015   Past Medical History:  Diagnosis Date   Distal radius fracture, right    mechanical fall   Hip fracture, left (HCC)    2017   Hyperglycemia    Kidney stone    X 2   UTI (lower urinary tract infection)    06/2015    Family History  Problem Relation Age of Onset   Cancer Father        cns   Diabetes Neg Hx    Heart disease Neg Hx    Stroke Neg Hx     Past Surgical History:  Procedure Laterality Date   CARDIAC CATHETERIZATION  2005   [facility]-report in echart-not in epic   CERVICAL FUSION  2006   CHOLECYSTECTOMY N/A 01/20/2021   Procedure: LAPAROSCOPIC CHOLECYSTECTOMY;  Surgeon: Lucretia Roers, MD;  Location: [facility];  Service: General;  Laterality: N/A;   COLONOSCOPY     COLONOSCOPY N/A 08/19/2014   Procedure: COLONOSCOPY;   Surgeon: Malissa Hippo, MD;  Location: [facility];  Service: Endoscopy;  Laterality: N/A;  830   FINGER ARTHROPLASTY  9/13   lt long and index   HEMORRHOID SURGERY     INTRAMEDULLARY (IM) NAIL INTERTROCHANTERIC Left 06/14/2015   Procedure: INTRAMEDULLARY (IM) NAIL INTERTROCHANTRIC;  Surgeon: Nadara Mustard, MD;  Location: [facility];  Service: Orthopedics;  Laterality: Left;   OPEN REDUCTION INTERNAL FIXATION (ORIF) DISTAL RADIAL FRACTURE Right 06/25/2014   Procedure: OPEN REDUCTION INTERNAL FIXATION (ORIF) RIGHT  DISTAL RADIUS FRACTURE;  Surgeon: Betha Loa, MD;  Location: [facility];  Service: Orthopedics;  Laterality: Right;   TENOLYSIS Left 05/02/2012   Procedure: LEFT LONG TENOLYSIS CAPSULE RELEASES MANIPULATION OF IP/MP JOINTS;  Surgeon: Wyn Forster., MD;  Location:  SURGERY CENTER;  Service: Orthopedics;  Laterality: Left;   TUBAL LIGATION     Social History   Occupational History   Not on file  Tobacco Use   Smoking status: Former    Current packs/day: 0.00    Types: Cigarettes    Quit date: 10/03/1986    Years since quitting: 36.2   Smokeless tobacco: Never  Vaping Use   Vaping status: Never Used  Substance and Sexual Activity   Alcohol use: No   Drug use: No   Sexual activity: Not on file

## 2023-01-12 DIAGNOSIS — Z299 Encounter for prophylactic measures, unspecified: Secondary | ICD-10-CM | POA: Diagnosis not present

## 2023-01-12 DIAGNOSIS — D692 Other nonthrombocytopenic purpura: Secondary | ICD-10-CM | POA: Diagnosis not present

## 2023-01-12 DIAGNOSIS — I739 Peripheral vascular disease, unspecified: Secondary | ICD-10-CM | POA: Diagnosis not present

## 2023-01-12 DIAGNOSIS — I700 Atherosclerosis of aorta: Secondary | ICD-10-CM | POA: Diagnosis not present

## 2023-01-12 DIAGNOSIS — J320 Chronic maxillary sinusitis: Secondary | ICD-10-CM | POA: Diagnosis not present

## 2023-02-12 DIAGNOSIS — R059 Cough, unspecified: Secondary | ICD-10-CM | POA: Diagnosis not present

## 2023-02-12 DIAGNOSIS — J069 Acute upper respiratory infection, unspecified: Secondary | ICD-10-CM | POA: Diagnosis not present

## 2023-02-12 DIAGNOSIS — R0981 Nasal congestion: Secondary | ICD-10-CM | POA: Diagnosis not present

## 2023-02-12 DIAGNOSIS — Z299 Encounter for prophylactic measures, unspecified: Secondary | ICD-10-CM | POA: Diagnosis not present

## 2023-03-27 ENCOUNTER — Ambulatory Visit (INDEPENDENT_AMBULATORY_CARE_PROVIDER_SITE_OTHER): Admitting: Orthopaedic Surgery

## 2023-03-27 ENCOUNTER — Encounter: Payer: Self-pay | Admitting: Orthopaedic Surgery

## 2023-03-27 DIAGNOSIS — M1711 Unilateral primary osteoarthritis, right knee: Secondary | ICD-10-CM | POA: Diagnosis not present

## 2023-03-27 MED ORDER — LIDOCAINE HCL 1 % IJ SOLN
2.0000 mL | INTRAMUSCULAR | Status: AC | PRN
  Administered 2023-03-27: 2 mL

## 2023-03-27 MED ORDER — METHYLPREDNISOLONE ACETATE 40 MG/ML IJ SUSP
40.0000 mg | INTRAMUSCULAR | Status: AC | PRN
  Administered 2023-03-27: 40 mg via INTRA_ARTICULAR

## 2023-03-27 MED ORDER — BUPIVACAINE HCL 0.5 % IJ SOLN
2.0000 mL | INTRAMUSCULAR | Status: AC | PRN
  Administered 2023-03-27: 2 mL via INTRA_ARTICULAR

## 2023-03-27 NOTE — Progress Notes (Signed)
 Office Visit Note   Patient: [name]           Date of Birth: [birth year]           MRN: [id number] Visit Date: 03/27/2023              Requested by: Ignatius Specking, MD [address] [place],  [place] [address] PCP: Ignatius Specking, MD   Assessment & Plan: Visit Diagnoses:  1. Primary osteoarthritis of right knee     Plan: [name] is a 78 year old female with recurrent right knee pain from osteoarthritis.  Cortisone injection repeated today.  She tolerated this well.  Follow-up as needed.  Follow-Up Instructions: No follow-ups on file.   Orders:  Orders Placed This Encounter  Procedures   Large Joint Inj: R knee   No orders of the defined types were placed in this encounter.     Procedures: Large Joint Inj: R knee on 03/27/2023 10:48 AM Indications: pain Details: 22 G needle  Arthrogram: No  Medications: 40 mg methylPREDNISolone acetate 40 MG/ML; 2 mL lidocaine 1 %; 2 mL bupivacaine 0.5 % Consent was given by the patient. Patient was prepped and draped in the usual sterile fashion.       Clinical Data: No additional findings.   Subjective: Chief Complaint  Patient presents with   Right Knee - Pain    HPI [name] is a 78 year old female here for follow-up evaluation of right knee pain.  We saw her about 3 months ago and did a cortisone injection.  Impression is osteoarthritis.  She felt really good relief from the injection and would like another 1. Review of Systems   Objective: Vital Signs: There were no vitals taken for this visit.  Physical Exam  Ortho Exam Examination of the right knee is unchanged from prior visit. Specialty Comments:  No specialty comments available.  Imaging: No results found.   PMFS History: Patient Active Problem List   Diagnosis Date Noted   Symptomatic cholelithiasis 01/20/2021   Cholelithiasis 01/19/2021   Closed left hip fracture (HCC) 07/02/2015   Anemia, unspecified 07/02/2015   Past Medical History:  Diagnosis  Date   Distal radius fracture, right    mechanical fall   Hip fracture, left (HCC)    2017   Hyperglycemia    Kidney stone    X 2   UTI (lower urinary tract infection)    06/2015    Family History  Problem Relation Age of Onset   Cancer Father        cns   Diabetes Neg Hx    Heart disease Neg Hx    Stroke Neg Hx     Past Surgical History:  Procedure Laterality Date   CARDIAC CATHETERIZATION  2005   [facility]-report in echart-not in epic   CERVICAL FUSION  2006   CHOLECYSTECTOMY N/A 01/20/2021   Procedure: LAPAROSCOPIC CHOLECYSTECTOMY;  Surgeon: Lucretia Roers, MD;  Location: [facility];  Service: General;  Laterality: N/A;   COLONOSCOPY     COLONOSCOPY N/A 08/19/2014   Procedure: COLONOSCOPY;  Surgeon: Malissa Hippo, MD;  [facility];  Service: Endoscopy;  Laterality: N/A;  830   FINGER ARTHROPLASTY  9/13   lt long and index   HEMORRHOID SURGERY     INTRAMEDULLARY (IM) NAIL INTERTROCHANTERIC Left 06/14/2015   Procedure: INTRAMEDULLARY (IM) NAIL INTERTROCHANTRIC;  Surgeon: Nadara Mustard, MD;  Location: [facility];  Service: Orthopedics;  Laterality: Left;   OPEN REDUCTION  INTERNAL FIXATION (ORIF) DISTAL RADIAL FRACTURE Right 06/25/2014   Procedure: OPEN REDUCTION INTERNAL FIXATION (ORIF) RIGHT  DISTAL RADIUS FRACTURE;  Surgeon: Betha Loa, MD;  Location: [facility];  Service: Orthopedics;  Laterality: Right;   TENOLYSIS Left 05/02/2012   Procedure: LEFT LONG TENOLYSIS CAPSULE RELEASES MANIPULATION OF IP/MP JOINTS;  Surgeon: Wyn Forster., MD;  Location: [facility];  Service: Orthopedics;  Laterality: Left;   TUBAL LIGATION     Social History   Occupational History   Not on file  Tobacco Use   Smoking status: Former    Current packs/day: 0.00    Types: Cigarettes    Quit date: 10/03/1986    Years since quitting: 36.5   Smokeless tobacco: Never  Vaping Use   Vaping status: Never Used  Substance and Sexual Activity   Alcohol use:  No   Drug use: No   Sexual activity: Not on file

## 2023-07-03 ENCOUNTER — Ambulatory Visit (INDEPENDENT_AMBULATORY_CARE_PROVIDER_SITE_OTHER): Admitting: Orthopaedic Surgery

## 2023-07-03 DIAGNOSIS — M1711 Unilateral primary osteoarthritis, right knee: Secondary | ICD-10-CM | POA: Diagnosis not present

## 2023-07-03 MED ORDER — LIDOCAINE HCL 1 % IJ SOLN
2.0000 mL | INTRAMUSCULAR | Status: AC | PRN
  Administered 2023-07-03: 2 mL

## 2023-07-03 MED ORDER — BUPIVACAINE HCL 0.5 % IJ SOLN
2.0000 mL | INTRAMUSCULAR | Status: AC | PRN
  Administered 2023-07-03: 2 mL via INTRA_ARTICULAR

## 2023-07-03 MED ORDER — METHYLPREDNISOLONE ACETATE 40 MG/ML IJ SUSP
40.0000 mg | INTRAMUSCULAR | Status: AC | PRN
  Administered 2023-07-03: 40 mg via INTRA_ARTICULAR

## 2023-07-03 NOTE — Progress Notes (Signed)
 Office Visit Note   Patient: [name]           Date of Birth: Nov 02, 1945           MRN: [id number] Visit Date: 07/03/2023              Requested by: Rosamond Leta NOVAK, MD [address] [place],  [place] [address] PCP: Rosamond Leta NOVAK, MD   Assessment & Plan: Visit Diagnoses:  1. Primary osteoarthritis of right knee     Plan: History of Present Illness [name] is a 78 year old female who presents with right knee pain.  She received a cortisone injection in the right knee three months ago, which alleviated pain for two months. The pain has since returned, primarily worsening at night after daytime activity. Yesterday, the pain was severe, but it is less intense today.  Right knee exam unchanged.  Assessment and Plan Right knee pain from osteoarthritis Chronic pain with intermittent exacerbations, previously responsive to cortisone injection. - Consider repeat cortisone injection today - Advise activity modification to reduce exacerbations. - Recommend NSAIDs as needed for pain management.  Follow-Up Instructions: No follow-ups on file.   Orders:  No orders of the defined types were placed in this encounter.  No orders of the defined types were placed in this encounter.     Procedures: Large Joint Inj: R knee on 07/03/2023 4:14 PM Indications: pain Details: 22 G needle  Arthrogram: No  Medications: 40 mg methylPREDNISolone  acetate 40 MG/ML; 2 mL lidocaine  1 %; 2 mL bupivacaine  0.5 % Consent was given by the patient. Patient was prepped and draped in the usual sterile fashion.     Past Medical History:  Diagnosis Date   Distal radius fracture, right    mechanical fall   Hip fracture, left (HCC)    2017   Hyperglycemia    Kidney stone    X 2   UTI (lower urinary tract infection)    06/2015    Family History  Problem Relation Age of Onset   Cancer Father        cns   Diabetes Neg Hx    Heart disease Neg Hx    Stroke Neg Hx     Past Surgical History:   Procedure Laterality Date   CARDIAC CATHETERIZATION  2005   [facility]-report in echart-not in epic   CERVICAL FUSION  2006   CHOLECYSTECTOMY N/A 01/20/2021   Procedure: LAPAROSCOPIC CHOLECYSTECTOMY;  Surgeon: Kallie Manuelita BROCKS, MD;  Location: [facility];  Service: General;  Laterality: N/A;   COLONOSCOPY     COLONOSCOPY N/A 08/19/2014   Procedure: COLONOSCOPY;  Surgeon: Claudis RAYMOND Rivet, MD;  [facility];  Service: Endoscopy;  Laterality: N/A;  830   FINGER ARTHROPLASTY  9/13   lt long and index   HEMORRHOID SURGERY     INTRAMEDULLARY (IM) NAIL INTERTROCHANTERIC Left 06/14/2015   Procedure: INTRAMEDULLARY (IM) NAIL INTERTROCHANTRIC;  Surgeon: Jerona Harden GAILS, MD;  Location: [facility];  Service: Orthopedics;  Laterality: Left;   OPEN REDUCTION INTERNAL FIXATION (ORIF) DISTAL RADIAL FRACTURE Right 06/25/2014   Procedure: OPEN REDUCTION INTERNAL FIXATION (ORIF) RIGHT  DISTAL RADIUS FRACTURE;  Surgeon: Franky Curia, MD;  Location: [facility];  Service: Orthopedics;  Laterality: Right;   TENOLYSIS Left 05/02/2012   Procedure: LEFT LONG TENOLYSIS CAPSULE RELEASES MANIPULATION OF IP/MP JOINTS;  Surgeon: Lamar GAILS Leonor Mickey., MD;  Location: [facility];  Service: Orthopedics;  Laterality: Left;  TUBAL LIGATION     Social History   Occupational History   Not on file  Tobacco Use   Smoking status: Former    Current packs/day: 0.00    Types: Cigarettes    Quit date: 10/03/1986    Years since quitting: 36.7   Smokeless tobacco: Never  Vaping Use   Vaping status: Never Used  Substance and Sexual Activity   Alcohol  use: No   Drug use: No   Sexual activity: Not on file

## 2023-07-24 DIAGNOSIS — Z299 Encounter for prophylactic measures, unspecified: Secondary | ICD-10-CM | POA: Diagnosis not present

## 2023-07-24 DIAGNOSIS — K146 Glossodynia: Secondary | ICD-10-CM | POA: Diagnosis not present

## 2023-07-24 DIAGNOSIS — R52 Pain, unspecified: Secondary | ICD-10-CM | POA: Diagnosis not present

## 2023-07-24 DIAGNOSIS — I700 Atherosclerosis of aorta: Secondary | ICD-10-CM | POA: Diagnosis not present

## 2023-10-02 DIAGNOSIS — E2839 Other primary ovarian failure: Secondary | ICD-10-CM | POA: Diagnosis not present

## 2023-10-18 DIAGNOSIS — Z1339 Encounter for screening examination for other mental health and behavioral disorders: Secondary | ICD-10-CM | POA: Diagnosis not present

## 2023-10-18 DIAGNOSIS — Z23 Encounter for immunization: Secondary | ICD-10-CM | POA: Diagnosis not present

## 2023-10-18 DIAGNOSIS — Z7189 Other specified counseling: Secondary | ICD-10-CM | POA: Diagnosis not present

## 2023-10-18 DIAGNOSIS — M8580 Other specified disorders of bone density and structure, unspecified site: Secondary | ICD-10-CM | POA: Diagnosis not present

## 2023-10-18 DIAGNOSIS — Z0000 Encounter for general adult medical examination without abnormal findings: Secondary | ICD-10-CM | POA: Diagnosis not present

## 2023-10-18 DIAGNOSIS — R5383 Other fatigue: Secondary | ICD-10-CM | POA: Diagnosis not present

## 2023-10-18 DIAGNOSIS — E7800 Pure hypercholesterolemia, unspecified: Secondary | ICD-10-CM | POA: Diagnosis not present

## 2023-10-18 DIAGNOSIS — Z79899 Other long term (current) drug therapy: Secondary | ICD-10-CM | POA: Diagnosis not present

## 2023-10-18 DIAGNOSIS — Z299 Encounter for prophylactic measures, unspecified: Secondary | ICD-10-CM | POA: Diagnosis not present

## 2023-10-18 DIAGNOSIS — Z1331 Encounter for screening for depression: Secondary | ICD-10-CM | POA: Diagnosis not present

## 2023-10-18 DIAGNOSIS — R52 Pain, unspecified: Secondary | ICD-10-CM | POA: Diagnosis not present

## 2023-11-08 DIAGNOSIS — Z1231 Encounter for screening mammogram for malignant neoplasm of breast: Secondary | ICD-10-CM | POA: Diagnosis not present

## 2023-11-12 ENCOUNTER — Encounter: Payer: Self-pay | Admitting: Radiology
